# Patient Record
Sex: Male | Born: 1955 | Race: White | Hispanic: No | Marital: Married | Smoking: Former smoker
Health system: Southern US, Community
[De-identification: ages and names within clinical notes are randomized; demographics above are authoritative.]

## PROBLEM LIST (undated history)

## (undated) DIAGNOSIS — Z8619 Personal history of other infectious and parasitic diseases: Secondary | ICD-10-CM

## (undated) DIAGNOSIS — I10 Essential (primary) hypertension: Secondary | ICD-10-CM

## (undated) DIAGNOSIS — R7303 Prediabetes: Secondary | ICD-10-CM

## (undated) DIAGNOSIS — M1990 Unspecified osteoarthritis, unspecified site: Secondary | ICD-10-CM

## (undated) HISTORY — PX: JOINT REPLACEMENT: SHX530

## (undated) HISTORY — PX: EYE SURGERY: SHX253

## (undated) HISTORY — PX: KNEE ARTHROSCOPY: SHX127

## (undated) HISTORY — PX: ARTHROSCOPIC REPAIR PCL: SUR81

## (undated) HISTORY — PX: ANKLE FRACTURE SURGERY: SHX122

## (undated) HISTORY — PX: TOTAL SHOULDER REPLACEMENT: SUR1217

---

## 1983-10-04 HISTORY — PX: FINGER SURGERY: SHX640

## 2004-10-03 HISTORY — PX: INGUINAL HERNIA REPAIR: SUR1180

## 2006-07-13 ENCOUNTER — Inpatient Hospital Stay (HOSPITAL_COMMUNITY): Admission: RE | Admit: 2006-07-13 | Discharge: 2006-07-14 | Payer: Self-pay | Admitting: Orthopaedic Surgery

## 2011-01-02 DIAGNOSIS — Z8619 Personal history of other infectious and parasitic diseases: Secondary | ICD-10-CM

## 2011-01-02 HISTORY — DX: Personal history of other infectious and parasitic diseases: Z86.19

## 2012-12-25 ENCOUNTER — Other Ambulatory Visit: Payer: Self-pay | Admitting: Orthopedic Surgery

## 2012-12-27 ENCOUNTER — Other Ambulatory Visit: Payer: Self-pay | Admitting: Orthopedic Surgery

## 2013-01-02 ENCOUNTER — Encounter (HOSPITAL_COMMUNITY): Payer: Self-pay

## 2013-01-02 ENCOUNTER — Encounter (HOSPITAL_COMMUNITY): Payer: Self-pay | Admitting: Pharmacy Technician

## 2013-01-02 ENCOUNTER — Ambulatory Visit (HOSPITAL_COMMUNITY)
Admission: RE | Admit: 2013-01-02 | Discharge: 2013-01-02 | Disposition: A | Discharge status: Home / Self Care | Payer: BC Managed Care – PPO | Source: Ambulatory Visit | Attending: Orthopedic Surgery | Admitting: Orthopedic Surgery

## 2013-01-02 ENCOUNTER — Encounter (HOSPITAL_COMMUNITY)
Admission: RE | Admit: 2013-01-02 | Discharge: 2013-01-02 | Disposition: A | Discharge status: Home / Self Care | Payer: BC Managed Care – PPO | Source: Ambulatory Visit | Attending: Orthopedic Surgery | Admitting: Orthopedic Surgery

## 2013-01-02 DIAGNOSIS — Z01812 Encounter for preprocedural laboratory examination: Secondary | ICD-10-CM | POA: Insufficient documentation

## 2013-01-02 DIAGNOSIS — Z01810 Encounter for preprocedural cardiovascular examination: Secondary | ICD-10-CM | POA: Insufficient documentation

## 2013-01-02 DIAGNOSIS — Z01818 Encounter for other preprocedural examination: Secondary | ICD-10-CM | POA: Insufficient documentation

## 2013-01-02 HISTORY — DX: Unspecified osteoarthritis, unspecified site: M19.90

## 2013-01-02 HISTORY — DX: Essential (primary) hypertension: I10

## 2013-01-02 LAB — BASIC METABOLIC PANEL
Chloride: 100 mEq/L (ref 96–112)
GFR calc Af Amer: >90 mL/min (ref >90)
Potassium: 4.4 mEq/L (ref 3.5–5.1)
Sodium: 139 mEq/L (ref 135–145)

## 2013-01-02 LAB — CBC WITH DIFFERENTIAL/PLATELET
Basophils Absolute: 0.1 10*3/uL (ref 0.0–0.1)
HCT: 46.1 % (ref 39.0–52.0)
Hemoglobin: 16.1 g/dL (ref 13.0–17.0)
Lymphocytes Relative: 25 % (ref 12–46)
Monocytes Absolute: 0.5 10*3/uL (ref 0.1–1.0)
Monocytes Relative: 6 % (ref 3–12)
Neutro Abs: 5 10*3/uL (ref 1.7–7.7)
RDW: 12.7 % (ref 11.5–15.5)
WBC: 7.5 10*3/uL (ref 4.0–10.5)

## 2013-01-02 LAB — APTT: aPTT: 35 seconds (ref 24–37)

## 2013-01-02 LAB — URINALYSIS, ROUTINE W REFLEX MICROSCOPIC
Hgb urine dipstick: NEGATIVE
Ketones, ur: NEGATIVE mg/dL
Protein, ur: NEGATIVE mg/dL
Urobilinogen, UA: 1 mg/dL (ref 0.0–1.0)

## 2013-01-02 LAB — TYPE AND SCREEN
ABO/RH(D): O POS
Antibody Screen: NEGATIVE

## 2013-01-02 LAB — SURGICAL PCR SCREEN: MRSA, PCR: NEGATIVE

## 2013-01-02 NOTE — Pre-Procedure Instructions (Signed)
KAHLEEL FADELEY  01/02/2013   Your procedure is scheduled on:  Wednesday, April 9th  Report to [facility] at 1045 AM.  Call this number if you have problems the morning of surgery: [phone]   Remember:   Do not eat food or drink liquids after midnight.   Take these medicines the morning of surgery with A SIP OF WATER:    Do not wear jewelry, make-up or nail polish.  Do not wear lotions, powders, or perfume, deodorant.  Do not shave 48 hours prior to surgery. Men may shave face and neck.  Do not bring valuables to the hospital.  Contacts, dentures or bridgework may not be worn into surgery.  Leave suitcase in the car. After surgery it may be brought to your room.  For patients admitted to the hospital, checkout time is 11:00 AM the day of discharge.   Patients discharged the day of surgery will not be allowed to drive home.    Special Instructions: Shower using CHG 2 nights before surgery and the night before surgery.  If you shower the day of surgery use CHG.  Use special wash - you have one bottle of CHG for all showers.  You should use approximately 1/3 of the bottle for each shower.   Please read over the following fact sheets that you were given: Pain Booklet, Coughing and Deep Breathing, Blood Transfusion Information, MRSA Information and Surgical Site Infection Prevention

## 2013-01-02 NOTE — Progress Notes (Signed)
Primary Physician - Dr. Margarette Asal - [place] No recent cardiac testing. No cardiologist

## 2013-01-08 NOTE — H&P (Signed)
Cesar Davis is an 57 y.o. male.   Chief Complaint: Right knee pain HPI: Cesar Davis returns in followup for his right knee.  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  He reports significant pain postoperatively with minimal improvement.  He is ambulating without crutches and has returned to work, but he has been using Norco several times a day for pain.  He denies any problems with his surgical wound.  He has not had a fever.  Past Medical History  Diagnosis Date  . Hypertension   . Arthritis     Past Surgical History  Procedure Laterality Date  . Arthroscopic repair pcl    . Ankle surgery    . Finger surgery    . Joint replacement Right     shoulder    No family history on file. Social History:  reports that he has never smoked. His smokeless tobacco use includes Chew. He reports that  drinks alcohol. He reports that he does not use illicit drugs.  Allergies: No Known Allergies  No prescriptions prior to admission    No results found for this or any previous visit (from the past 48 hour(s)). No results found.  Review of Systems  Constitutional: Negative.   HENT: Negative.   Eyes: Negative.   Respiratory: Negative.   Cardiovascular: Negative.   Gastrointestinal: Negative.   Genitourinary: Negative.   Musculoskeletal: Positive for joint pain.  Skin: Negative.   Neurological: Negative.   Endo/Heme/Allergies: Negative.   Psychiatric/Behavioral: Negative.     There were no vitals taken for this visit. Physical Exam  Constitutional: He is oriented to person, place, and time. He appears well-developed and well-nourished.  HENT:  Head: Normocephalic.  Eyes: Pupils are equal, round, and reactive to light.  Neck: Normal range of motion.  Cardiovascular: Normal heart sounds.   Respiratory: Breath sounds normal.  Musculoskeletal:       Right knee: He  exhibits decreased range of motion and swelling. Tenderness found. Medial joint line tenderness noted.  Neurological: He is alert and oriented to person, place, and time.  Psychiatric: He has a normal mood and affect.    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  Assessment/Plan A: End-stage arthritis of the right knee s/p arthroscopy  P: We have discussed the options in detail with Cesar Davis today.  He is not interested in Visco supplementation injections at this point.  He would like to proceed with knee replacement.  All risks and benefits of surgery were discussed.    Towanna Avery M. 01/08/2013, 1:37 PM

## 2013-01-09 ENCOUNTER — Encounter (HOSPITAL_COMMUNITY): Payer: Self-pay | Admitting: *Deleted

## 2013-01-09 ENCOUNTER — Inpatient Hospital Stay (HOSPITAL_COMMUNITY)
Admission: RE | Admit: 2013-01-09 | Discharge: 2013-01-11 | DRG: 209 | Disposition: A | Discharge status: Home Health | Payer: BC Managed Care – PPO | Source: Ambulatory Visit | Attending: Orthopedic Surgery | Admitting: Orthopedic Surgery

## 2013-01-09 ENCOUNTER — Encounter (HOSPITAL_COMMUNITY)
Admission: RE | Disposition: A | Discharge status: Home Health | Payer: Self-pay | Source: Ambulatory Visit | Attending: Orthopedic Surgery

## 2013-01-09 ENCOUNTER — Inpatient Hospital Stay (HOSPITAL_COMMUNITY): Payer: BC Managed Care – PPO | Admitting: *Deleted

## 2013-01-09 DIAGNOSIS — M1711 Unilateral primary osteoarthritis, right knee: Secondary | ICD-10-CM

## 2013-01-09 DIAGNOSIS — I10 Essential (primary) hypertension: Secondary | ICD-10-CM | POA: Diagnosis present

## 2013-01-09 DIAGNOSIS — M129 Arthropathy, unspecified: Secondary | ICD-10-CM | POA: Diagnosis present

## 2013-01-09 DIAGNOSIS — M9420 Chondromalacia, unspecified site: Secondary | ICD-10-CM | POA: Diagnosis present

## 2013-01-09 DIAGNOSIS — Z79899 Other long term (current) drug therapy: Secondary | ICD-10-CM

## 2013-01-09 DIAGNOSIS — Z96619 Presence of unspecified artificial shoulder joint: Secondary | ICD-10-CM

## 2013-01-09 DIAGNOSIS — M1710 Unilateral primary osteoarthritis, unspecified knee: Principal | ICD-10-CM | POA: Diagnosis present

## 2013-01-09 HISTORY — PX: TOTAL KNEE ARTHROPLASTY: SHX125

## 2013-01-09 HISTORY — DX: Personal history of other infectious and parasitic diseases: Z86.19

## 2013-01-09 SURGERY — ARTHROPLASTY, KNEE, TOTAL
Anesthesia: Choice | Site: Knee | Laterality: Right | Wound class: Clean

## 2013-01-09 MED ORDER — LACTATED RINGERS IV SOLN
INTRAVENOUS | Status: DC
  Administered 2013-01-09: 12:00:00 via INTRAVENOUS

## 2013-01-09 MED ORDER — LACTATED RINGERS IV SOLN
INTRAVENOUS | Status: DC | PRN
  Administered 2013-01-09: 13:00:00 via INTRAVENOUS

## 2013-01-09 MED ORDER — SODIUM CHLORIDE 0.9 % IJ SOLN
1000.0000 ug | INTRAVENOUS | Status: DC | PRN
  Administered 2013-01-09: .2 ug/kg/min via INTRAVENOUS

## 2013-01-09 MED ORDER — HYDROMORPHONE HCL PF 1 MG/ML IJ SOLN: 0.2500 mg | INTRAMUSCULAR | Status: DC | PRN

## 2013-01-09 MED ORDER — ACETAMINOPHEN 10 MG/ML IV SOLN
1000.0000 mg | Freq: Four times a day (QID) | INTRAVENOUS | Status: AC
  Administered 2013-01-09 – 2013-01-10 (×4): 1000 mg via INTRAVENOUS
  Filled 2013-01-09 (×4): qty 100

## 2013-01-09 MED ORDER — ONDANSETRON HCL 4 MG/2ML IJ SOLN
INTRAMUSCULAR | Status: DC | PRN
  Administered 2013-01-09: 4 mg via INTRAVENOUS

## 2013-01-09 MED ORDER — PHENOL 1.4 % MT LIQD: 1.0000 | OROMUCOSAL | Status: DC | PRN

## 2013-01-09 MED ORDER — PROPOFOL 10 MG/ML IV BOLUS
INTRAVENOUS | Status: DC | PRN
  Administered 2013-01-09: 200 mg via INTRAVENOUS

## 2013-01-09 MED ORDER — MIDAZOLAM HCL 5 MG/5ML IJ SOLN
INTRAMUSCULAR | Status: DC | PRN
  Administered 2013-01-09: 2 mg via INTRAVENOUS

## 2013-01-09 MED ORDER — HYDROMORPHONE HCL PF 1 MG/ML IJ SOLN
INTRAMUSCULAR | Status: AC
  Filled 2013-01-09: qty 1

## 2013-01-09 MED ORDER — METHOCARBAMOL 500 MG PO TABS
500.0000 mg | ORAL_TABLET | Freq: Four times a day (QID) | ORAL | Status: DC | PRN
  Administered 2013-01-09 – 2013-01-11 (×6): 500 mg via ORAL
  Filled 2013-01-09 (×6): qty 1

## 2013-01-09 MED ORDER — METHOCARBAMOL 100 MG/ML IJ SOLN: 500.0000 mg | Freq: Four times a day (QID) | INTRAVENOUS | Status: DC | PRN

## 2013-01-09 MED ORDER — METOCLOPRAMIDE HCL 5 MG/ML IJ SOLN: 5.0000 mg | Freq: Three times a day (TID) | INTRAMUSCULAR | Status: DC | PRN

## 2013-01-09 MED ORDER — DEXTROSE-NACL 5-0.45 % IV SOLN: INTRAVENOUS | Status: DC

## 2013-01-09 MED ORDER — ALUM & MAG HYDROXIDE-SIMETH 200-200-20 MG/5ML PO SUSP: 30.0000 mL | ORAL | Status: DC | PRN

## 2013-01-09 MED ORDER — ONDANSETRON HCL 4 MG PO TABS: 4.0000 mg | ORAL_TABLET | Freq: Four times a day (QID) | ORAL | Status: DC | PRN

## 2013-01-09 MED ORDER — CHLORHEXIDINE GLUCONATE 4 % EX LIQD: 60.0000 mL | Freq: Once | CUTANEOUS | Status: DC

## 2013-01-09 MED ORDER — KCL IN DEXTROSE-NACL 20-5-0.45 MEQ/L-%-% IV SOLN
INTRAVENOUS | Status: DC
  Administered 2013-01-09 – 2013-01-10 (×2): via INTRAVENOUS
  Filled 2013-01-09 (×7): qty 1000

## 2013-01-09 MED ORDER — CEFUROXIME SODIUM 1.5 G IJ SOLR
INTRAMUSCULAR | Status: DC | PRN
  Administered 2013-01-09: 1.5 g

## 2013-01-09 MED ORDER — KCL IN DEXTROSE-NACL 20-5-0.45 MEQ/L-%-% IV SOLN
INTRAVENOUS | Status: AC
  Administered 2013-01-09: 1000 mL
  Filled 2013-01-09: qty 1000

## 2013-01-09 MED ORDER — HYDROMORPHONE HCL PF 1 MG/ML IJ SOLN
INTRAMUSCULAR | Status: DC | PRN
  Administered 2013-01-09 (×2): .5 mg via INTRAVENOUS

## 2013-01-09 MED ORDER — OXYCODONE HCL 5 MG PO TABS
5.0000 mg | ORAL_TABLET | ORAL | Status: DC | PRN
  Administered 2013-01-09 – 2013-01-11 (×10): 10 mg via ORAL
  Filled 2013-01-09 (×9): qty 2
  Filled 2013-01-09: qty 1
  Filled 2013-01-09: qty 2

## 2013-01-09 MED ORDER — BISACODYL 5 MG PO TBEC: 5.0000 mg | DELAYED_RELEASE_TABLET | Freq: Every day | ORAL | Status: DC | PRN

## 2013-01-09 MED ORDER — SODIUM CHLORIDE 0.9 % IJ SOLN
INTRAMUSCULAR | Status: DC | PRN
  Administered 2013-01-09: 14:00:00

## 2013-01-09 MED ORDER — MEPERIDINE HCL 25 MG/ML IJ SOLN: 6.2500 mg | INTRAMUSCULAR | Status: DC | PRN

## 2013-01-09 MED ORDER — METOPROLOL SUCCINATE ER 25 MG PO TB24
25.0000 mg | ORAL_TABLET | Freq: Every day | ORAL | Status: DC
  Administered 2013-01-10 – 2013-01-11 (×2): 25 mg via ORAL
  Filled 2013-01-09 (×2): qty 1

## 2013-01-09 MED ORDER — CEFAZOLIN SODIUM-DEXTROSE 2-3 GM-% IV SOLR
2.0000 g | INTRAVENOUS | Status: AC
  Administered 2013-01-09: 2 g via INTRAVENOUS

## 2013-01-09 MED ORDER — OXYCODONE HCL 5 MG PO TABS: 5.0000 mg | ORAL_TABLET | Freq: Once | ORAL | Status: DC | PRN

## 2013-01-09 MED ORDER — OXYCODONE HCL 5 MG/5ML PO SOLN: 5.0000 mg | Freq: Once | ORAL | Status: DC | PRN

## 2013-01-09 MED ORDER — METOCLOPRAMIDE HCL 10 MG PO TABS
5.0000 mg | ORAL_TABLET | Freq: Three times a day (TID) | ORAL | Status: DC | PRN
  Administered 2013-01-10: 10 mg via ORAL
  Filled 2013-01-09: qty 1

## 2013-01-09 MED ORDER — ACETAMINOPHEN 325 MG PO TABS: 650.0000 mg | ORAL_TABLET | Freq: Four times a day (QID) | ORAL | Status: DC | PRN

## 2013-01-09 MED ORDER — CELECOXIB 200 MG PO CAPS
200.0000 mg | ORAL_CAPSULE | Freq: Two times a day (BID) | ORAL | Status: DC
  Administered 2013-01-09 – 2013-01-11 (×4): 200 mg via ORAL
  Filled 2013-01-09 (×5): qty 1

## 2013-01-09 MED ORDER — HYDROMORPHONE HCL PF 1 MG/ML IJ SOLN
0.2500 mg | INTRAMUSCULAR | Status: DC | PRN
  Administered 2013-01-09 (×3): 0.5 mg via INTRAVENOUS

## 2013-01-09 MED ORDER — FLEET ENEMA 7-19 GM/118ML RE ENEM: 1.0000 | ENEMA | Freq: Once | RECTAL | Status: AC | PRN

## 2013-01-09 MED ORDER — NEOSTIGMINE METHYLSULFATE 1 MG/ML IJ SOLN
INTRAMUSCULAR | Status: DC | PRN
  Administered 2013-01-09: 5 mg via INTRAVENOUS

## 2013-01-09 MED ORDER — GLYCOPYRROLATE 0.2 MG/ML IJ SOLN
INTRAMUSCULAR | Status: DC | PRN
  Administered 2013-01-09: .6 mg via INTRAVENOUS
  Administered 2013-01-09: .2 mg via INTRAVENOUS

## 2013-01-09 MED ORDER — HYDROMORPHONE HCL PF 1 MG/ML IJ SOLN
INTRAMUSCULAR | Status: AC
  Administered 2013-01-09: 0.5 mg via INTRAVENOUS
  Filled 2013-01-09: qty 1

## 2013-01-09 MED ORDER — CEFUROXIME SODIUM 1.5 G IJ SOLR
INTRAMUSCULAR | Status: AC
  Filled 2013-01-09: qty 1.5

## 2013-01-09 MED ORDER — PROMETHAZINE HCL 25 MG/ML IJ SOLN: 6.2500 mg | INTRAMUSCULAR | Status: DC | PRN

## 2013-01-09 MED ORDER — ONDANSETRON HCL 4 MG/2ML IJ SOLN: 4.0000 mg | Freq: Four times a day (QID) | INTRAMUSCULAR | Status: DC | PRN

## 2013-01-09 MED ORDER — ACETAMINOPHEN 10 MG/ML IV SOLN
1000.0000 mg | Freq: Once | INTRAVENOUS | Status: DC
  Filled 2013-01-09: qty 100

## 2013-01-09 MED ORDER — MENTHOL 3 MG MT LOZG
1.0000 | LOZENGE | OROMUCOSAL | Status: DC | PRN
  Administered 2013-01-10: 3 mg via ORAL
  Filled 2013-01-09: qty 9

## 2013-01-09 MED ORDER — LIDOCAINE HCL (CARDIAC) 20 MG/ML IV SOLN
INTRAVENOUS | Status: DC | PRN
  Administered 2013-01-09: 100 mg via INTRAVENOUS

## 2013-01-09 MED ORDER — MAGNESIUM HYDROXIDE 400 MG/5ML PO SUSP: 30.0000 mL | Freq: Every day | ORAL | Status: DC | PRN

## 2013-01-09 MED ORDER — LISINOPRIL 10 MG PO TABS
10.0000 mg | ORAL_TABLET | Freq: Every day | ORAL | Status: DC
  Administered 2013-01-09 – 2013-01-11 (×3): 10 mg via ORAL
  Filled 2013-01-09 (×3): qty 1

## 2013-01-09 MED ORDER — ACETAMINOPHEN 10 MG/ML IV SOLN
INTRAVENOUS | Status: AC
  Administered 2013-01-09: 1000 mg via INTRAVENOUS
  Filled 2013-01-09: qty 100

## 2013-01-09 MED ORDER — ROCURONIUM BROMIDE 100 MG/10ML IV SOLN
INTRAVENOUS | Status: DC | PRN
  Administered 2013-01-09: 50 mg via INTRAVENOUS

## 2013-01-09 MED ORDER — ZOLPIDEM TARTRATE 5 MG PO TABS: 5.0000 mg | ORAL_TABLET | Freq: Every evening | ORAL | Status: DC | PRN

## 2013-01-09 MED ORDER — ACETAMINOPHEN 650 MG RE SUPP: 650.0000 mg | Freq: Four times a day (QID) | RECTAL | Status: DC | PRN

## 2013-01-09 MED ORDER — DIPHENHYDRAMINE HCL 12.5 MG/5ML PO ELIX: 12.5000 mg | ORAL_SOLUTION | ORAL | Status: DC | PRN

## 2013-01-09 MED ORDER — ASPIRIN EC 325 MG PO TBEC
325.0000 mg | DELAYED_RELEASE_TABLET | Freq: Two times a day (BID) | ORAL | Status: DC
  Administered 2013-01-09 – 2013-01-11 (×4): 325 mg via ORAL
  Filled 2013-01-09 (×5): qty 1

## 2013-01-09 MED ORDER — HYDROMORPHONE HCL PF 1 MG/ML IJ SOLN
1.0000 mg | INTRAMUSCULAR | Status: DC | PRN
  Administered 2013-01-09 – 2013-01-10 (×7): 1 mg via INTRAVENOUS
  Filled 2013-01-09 (×7): qty 1

## 2013-01-09 SURGICAL SUPPLY — 54 items
BANDAGE ELASTIC 6 VELCRO ST LF (GAUZE/BANDAGES/DRESSINGS) ×1 IMPLANT
BANDAGE ESMARK 6X9 LF (GAUZE/BANDAGES/DRESSINGS) ×1 IMPLANT
BLADE SAG 18X100X1.27 (BLADE) ×2 IMPLANT
BLADE SAW SGTL 13X75X1.27 (BLADE) ×2 IMPLANT
BLADE SURG ROTATE 9660 (MISCELLANEOUS) IMPLANT
BNDG CMPR 9X6 STRL LF SNTH (GAUZE/BANDAGES/DRESSINGS) ×1
BNDG CMPR MED 10X6 ELC LF (GAUZE/BANDAGES/DRESSINGS) ×1
BNDG ELASTIC 6X10 VLCR STRL LF (GAUZE/BANDAGES/DRESSINGS) ×2 IMPLANT
BNDG ESMARK 6X9 LF (GAUZE/BANDAGES/DRESSINGS) ×2
BOWL SMART MIX CTS (DISPOSABLE) ×2 IMPLANT
CEMENT HV SMART SET (Cement) ×4 IMPLANT
CLOTH BEACON ORANGE TIMEOUT ST (SAFETY) ×2 IMPLANT
COVER SURGICAL LIGHT HANDLE (MISCELLANEOUS) ×2 IMPLANT
CUFF TOURNIQUET SINGLE 34IN LL (TOURNIQUET CUFF) IMPLANT
CUFF TOURNIQUET SINGLE 44IN (TOURNIQUET CUFF) IMPLANT
DRAPE EXTREMITY T 121X128X90 (DRAPE) ×2 IMPLANT
DRAPE U-SHAPE 47X51 STRL (DRAPES) ×2 IMPLANT
DURAPREP 26ML APPLICATOR (WOUND CARE) ×2 IMPLANT
ELECT REM PT RETURN 9FT ADLT (ELECTROSURGICAL) ×2
ELECTRODE REM PT RTRN 9FT ADLT (ELECTROSURGICAL) ×1 IMPLANT
EVACUATOR 1/8 PVC DRAIN (DRAIN) ×2 IMPLANT
GAUZE XEROFORM 1X8 LF (GAUZE/BANDAGES/DRESSINGS) ×2 IMPLANT
GLOVE BIO SURGEON STRL SZ7 (GLOVE) ×2 IMPLANT
GLOVE BIO SURGEON STRL SZ7.5 (GLOVE) ×2 IMPLANT
GLOVE BIOGEL PI IND STRL 7.0 (GLOVE) ×1 IMPLANT
GLOVE BIOGEL PI IND STRL 8 (GLOVE) ×1 IMPLANT
GLOVE BIOGEL PI INDICATOR 7.0 (GLOVE) ×1
GLOVE BIOGEL PI INDICATOR 8 (GLOVE) ×1
GOWN PREVENTION PLUS XLARGE (GOWN DISPOSABLE) ×2 IMPLANT
GOWN STRL NON-REIN LRG LVL3 (GOWN DISPOSABLE) ×4 IMPLANT
HANDPIECE INTERPULSE COAX TIP (DISPOSABLE) ×2
HOOD PEEL AWAY FACE SHEILD DIS (HOOD) ×6 IMPLANT
KIT BASIN OR (CUSTOM PROCEDURE TRAY) ×2 IMPLANT
KIT ROOM TURNOVER OR (KITS) ×2 IMPLANT
MANIFOLD NEPTUNE II (INSTRUMENTS) ×2 IMPLANT
NS IRRIG 1000ML POUR BTL (IV SOLUTION) ×2 IMPLANT
PACK TOTAL JOINT (CUSTOM PROCEDURE TRAY) ×2 IMPLANT
PAD ARMBOARD 7.5X6 YLW CONV (MISCELLANEOUS) ×4 IMPLANT
PADDING CAST COTTON 6X4 STRL (CAST SUPPLIES) ×2 IMPLANT
SET HNDPC FAN SPRY TIP SCT (DISPOSABLE) ×1 IMPLANT
SPONGE GAUZE 4X4 12PLY (GAUZE/BANDAGES/DRESSINGS) ×3 IMPLANT
STAPLER VISISTAT 35W (STAPLE) ×2 IMPLANT
SUCTION FRAZIER TIP 10 FR DISP (SUCTIONS) ×2 IMPLANT
SUT VIC AB 0 CTX 36 (SUTURE) ×2
SUT VIC AB 0 CTX36XBRD ANTBCTR (SUTURE) ×1 IMPLANT
SUT VIC AB 1 CTX 36 (SUTURE) ×2
SUT VIC AB 1 CTX36XBRD ANBCTR (SUTURE) ×1 IMPLANT
SUT VIC AB 2-0 CT1 27 (SUTURE) ×2
SUT VIC AB 2-0 CT1 TAPERPNT 27 (SUTURE) ×1 IMPLANT
TOWEL OR 17X24 6PK STRL BLUE (TOWEL DISPOSABLE) ×2 IMPLANT
TOWEL OR 17X26 10 PK STRL BLUE (TOWEL DISPOSABLE) ×2 IMPLANT
TRAY FOLEY CATH 14FR (SET/KITS/TRAYS/PACK) IMPLANT
TRAY FOLEY CATH 16FR SILVER (SET/KITS/TRAYS/PACK) ×1 IMPLANT
WATER STERILE IRR 1000ML POUR (IV SOLUTION) ×4 IMPLANT

## 2013-01-09 NOTE — Transfer of Care (Signed)
Immediate Anesthesia Transfer of Care Note  Patient: Cesar Davis  Procedure(s) Performed: Procedure(s): TOTAL KNEE ARTHROPLASTY (Right)  Patient Location: PACU  Anesthesia Type:General  Level of Consciousness: awake and alert   Airway & Oxygen Therapy: Patient Spontanous Breathing and Patient connected to face mask oxygen  Post-op Assessment: Report given to PACU RN and Post -op Vital signs reviewed and stable  Post vital signs: Reviewed and stable  Complications: No apparent anesthesia complications

## 2013-01-09 NOTE — Preoperative (Signed)
Beta Blockers   Reason not to administer Beta Blockers:Not Applicable 

## 2013-01-09 NOTE — Anesthesia Postprocedure Evaluation (Signed)
  Anesthesia Post-op Note  Patient: Cesar Davis  Procedure(s) Performed: Procedure(s): TOTAL KNEE ARTHROPLASTY (Right)  Patient Location: PACU  Anesthesia Type:General  Level of Consciousness: awake and oriented  Airway and Oxygen Therapy: Patient Spontanous Breathing  Post-op Pain: moderate  Post-op Assessment: Post-op Vital signs reviewed  Post-op Vital Signs: stable  Complications: No apparent anesthesia complications

## 2013-01-09 NOTE — Op Note (Signed)
PATIENT ID:      Cesar Davis  MRN:     [id number] DOB/AGE:    1955/12/26 / 57 y.o.       OPERATIVE REPORT    DATE OF PROCEDURE:  01/09/2013       PREOPERATIVE DIAGNOSIS:   DEGENERATIVE JOINT DISEASE RIGHT KNEE      There is no weight on file to calculate BMI.                                                        POSTOPERATIVE DIAGNOSIS:   same                                                                      PROCEDURE:  Procedure(s): TOTAL KNEE ARTHROPLASTY Using Depuy Sigma RP implants #5R Femur, #5Tibia, 10mm sigma RP bearing, 38 Patella     SURGEON: Robbie Nangle J    ASSISTANT:   Shirl Harris PA-C   (Present and scrubbed throughout the case, critical for assistance with exposure, retraction, instrumentation, and closure.)         ANESTHESIA: GET with Femoral Nerve Block  DRAINS: foley, 2 medium hemovac in knee   TOURNIQUET TIME:   COMPLICATIONS:  None     SPECIMENS: None   INDICATIONS FOR PROCEDURE: The patient has  DEGENERATIVE JOINT DISEASE RIGHT KNEE, varus deformities, Arthroscopy showed large ares of trochlear and medial and lateral grade 4 chondromalacia. Patient has failed all conservative measures including anti-inflammatory medicines, narcotics, attempts at  exercise and weight loss, cortisone injections and arthroscopy.  Risks and benefits of surgery have been discussed, questions answered.   DESCRIPTION OF PROCEDURE: The patient identified by armband, received  right femoral nerve block and IV antibiotics, in the holding area at [facility]. Patient taken to the operating room, appropriate anesthetic  monitors were attached General endotracheal anesthesia induced with  the patient in supine position, Foley catheter was inserted. Tourniquet  applied high to the operative thigh. Lateral post and foot positioner  applied to the table, the lower extremity was then prepped and draped  in usual sterile fashion from the ankle to the tourniquet. Time-out  procedure was performed. The limb was wrapped with an Esmarch bandage and the tourniquet inflated to 350 mmHg. We began the operation by making the anterior midline incision starting at handbreadth above the patella going over the patella 1 cm medial to and  4 cm distal to the tibial tubercle. Small bleeders in the skin and the  subcutaneous tissue identified and cauterized. Transverse retinaculum was incised and reflected medially and a medial parapatellar arthrotomy was accomplished. the patella was everted and theprepatellar fat pad resected. The superficial medial collateral  ligament was then elevated from anterior to posterior along the proximal  flare of the tibia and anterior half of the menisci resected. The knee was hyperflexed exposing bone on bone arthritis. Peripheral and notch osteophytes as well as the cruciate ligaments were then resected. We continued to  work our way around posteriorly along the proximal tibia, and externally  rotated the tibia  subluxing it out from underneath the femur. A McHale  retractor was placed through the notch and a lateral Hohmann retractor  placed, and we then drilled through the proximal tibia in line with the  axis of the tibia followed by an intramedullary guide rod and 2-degree  posterior slope cutting guide. The tibial cutting guide was pinned into place  allowing resection of 8 mm of bone medially and about 8 mm of bone  laterally because of her varus deformity. Satisfied with the tibial resection, we then  entered the distal femur 2 mm anterior to the PCL origin with the  intramedullary guide rod and applied the distal femoral cutting guide  set at 11mm, with 5 degrees of valgus. This was pinned along the  epicondylar axis. At this point, the distal femoral cut was accomplished without difficulty. We then sized for a #5R femoral component and pinned the guide in 0 degrees of external rotation.The chamfer cutting guide was pinned into place. The  anterior, posterior, and chamfer cuts were accomplished without difficulty followed by  the Sigma RP box cutting guide and the box cut. We also removed posterior osteophytes from the posterior femoral condyles. At this  time, the knee was brought into full extension. We checked our  extension and flexion gaps and found them symmetric at 10mm.  The patella thickness measured at 26 mm. We set the cutting guide at 15 and removed the posterior 9.5-10 mm  of the patella sized for 38 button and drilled the lollipop. The knee  was then once again hyperflexed exposing the proximal tibia. We sized for a #5 tibial base plate, applied the smokestack and the conical reamer followed by the the Delta fin keel punch. We then hammered into place the Sigma RP trial femoral component, inserted a 10-mm trial bearing, trial patellar button, and took the knee through range of motion from 0-130 degrees. No thumb pressure was required for patellar  tracking. At this point, all trial components were removed, a double batch of DePuy HV cement with 1500 mg of Zinacef was mixed and applied to all bony metallic mating surfaces except for the posterior condyles of the femur itself. In order, we  hammered into place the tibial tray and removed excess cement, the femoral component and removed excess cement, a 10-mm Sigma RP bearing  was inserted, and the knee brought to full extension with compression.  The patellar button was clamped into place, and excess cement  removed. While the cement cured the wound was irrigated out with normal saline solution pulse lavage, and medium Hemovac drains were placed from an anterolateral  approach. Ligament stability and patellar tracking were checked and found to be excellent. The parapatellar arthrotomy was closed with  running #1 Vicryl suture. The subcutaneous tissue with 0 and 2-0 undyed  Vicryl suture, and the skin with skin staples. A dressing of Xeroform,  4 x 4, dressing sponges,  Webril, and Ace wrap applied. The patient  awakened, extubated, and taken to recovery room without difficulty.   Gean Birchwood J 01/09/2013, 2:26 PM

## 2013-01-09 NOTE — Progress Notes (Signed)
Dr. Jacklynn Bue called to inform patient is at 8/10 pain level no orders received to take patient to floor

## 2013-01-09 NOTE — Interval H&P Note (Signed)
History and Physical Interval Note:  01/09/2013 12:34 PM  Cesar Davis  has presented today for surgery, with the diagnosis of DEGENERATIVE JOINT DISEASE RIGHT KNEE  The various methods of treatment have been discussed with the patient and family. After consideration of risks, benefits and other options for treatment, the patient has consented to  Procedure(s): TOTAL KNEE ARTHROPLASTY (Right) as a surgical intervention .  The patient's history has been reviewed, patient examined, no change in status, stable for surgery.  I have reviewed the patient's chart and labs.  Questions were answered to the patient's satisfaction.     Nestor Lewandowsky

## 2013-01-09 NOTE — Anesthesia Procedure Notes (Signed)
Procedure Name: Intubation Date/Time: 01/09/2013 1:09 PM Performed by: Cesar Davis Pre-anesthesia Checklist: Patient identified, Emergency Drugs available, Suction available, Patient being monitored and Timeout performed Patient Re-evaluated:Patient Re-evaluated prior to inductionOxygen Delivery Method: Circle system utilized Preoxygenation: Pre-oxygenation with 100% oxygen Intubation Type: IV induction Ventilation: Mask ventilation without difficulty Laryngoscope Size: Miller and 2 Grade View: Grade II Tube type: Oral Tube size: 7.5 mm Airway Equipment and Method: Stylet Placement Confirmation: ETT inserted through vocal cords under direct vision,  positive ETCO2 and breath sounds checked- equal and bilateral Secured at: 23 cm Tube secured with: Tape Dental Injury: Teeth and Oropharynx as per pre-operative assessment

## 2013-01-09 NOTE — Progress Notes (Signed)
This note also relates to the following rows which could not be included: Pulse Rate - Cannot attach notes to unvalidated device data ECG Heart Rate - Cannot attach notes to unvalidated device data

## 2013-01-09 NOTE — Anesthesia Preprocedure Evaluation (Addendum)
Anesthesia Evaluation  Patient identified by MRN, date of birth, ID band Patient awake    Reviewed: Allergy & Precautions, H&P , NPO status , Patient's Chart, lab work & pertinent test results  History of Anesthesia Complications Negative for: history of anesthetic complications  Airway Mallampati: I  Neck ROM: full    Dental no notable dental hx. (+) Teeth Intact   Pulmonary neg pulmonary ROS,  breath sounds clear to auscultation  Pulmonary exam normal       Cardiovascular hypertension, On Medications negative cardio ROS  IRhythm:regular Rate:Normal     Neuro/Psych negative neurological ROS  negative psych ROS   GI/Hepatic negative GI ROS, Neg liver ROS,   Endo/Other  negative endocrine ROS  Renal/GU negative Renal ROS  negative genitourinary   Musculoskeletal   Abdominal   Peds  Hematology negative hematology ROS (+)   Anesthesia Other Findings   Reproductive/Obstetrics negative OB ROS                           Anesthesia Physical Anesthesia Plan  ASA: II  Anesthesia Plan: General and General ETT   Post-op Pain Management:    Induction:   Airway Management Planned:   Additional Equipment:   Intra-op Plan:   Post-operative Plan:   Informed Consent: I have reviewed the patients History and Physical, chart, labs and discussed the procedure including the risks, benefits and alternatives for the proposed anesthesia with the patient or authorized representative who has indicated his/her understanding and acceptance.     Plan Discussed with: CRNA and Surgeon  Anesthesia Plan Comments:         Anesthesia Quick Evaluation

## 2013-01-09 NOTE — Progress Notes (Signed)
Pt had exparel in OR and received pain med ication on coming out of [facility]  Dr. Jacklynn Bue caleed to see what he would like to do for pain med . Pat complains his pain is at 8/10 . May give hydromorphone 1 mg IVP

## 2013-01-09 NOTE — Progress Notes (Signed)
Patient remains in pain Dr. Jacklynn Bue called may give up to  another Mg of hydromorphone

## 2013-01-09 NOTE — Progress Notes (Signed)
Orthopedic Tech Progress Note Patient Details:  Cesar Davis [birth year] [id number]  CPM Right Knee CPM Right Knee: On Right Knee Flexion (Degrees): 60 Right Knee Extension (Degrees): 0 Additional Comments: trapeze bar   Cammer, Mickie Bail 01/09/2013, 4:30 PM

## 2013-01-10 ENCOUNTER — Encounter (HOSPITAL_COMMUNITY): Payer: Self-pay | Admitting: Orthopedic Surgery

## 2013-01-10 LAB — BASIC METABOLIC PANEL
Calcium: 8.9 mg/dL (ref 8.4–10.5)
GFR calc Af Amer: >90 mL/min (ref >90)
GFR calc non Af Amer: >90 mL/min (ref >90)
Glucose, Bld: 131 mg/dL — ABNORMAL HIGH (ref 70–99)
Potassium: 3.8 mEq/L (ref 3.5–5.1)
Sodium: 131 mEq/L — ABNORMAL LOW (ref 135–145)

## 2013-01-10 LAB — CBC
Hemoglobin: 12.9 g/dL — ABNORMAL LOW (ref 13.0–17.0)
MCH: 28.7 pg (ref 26.0–34.0)
MCHC: 34.9 g/dL (ref 30.0–36.0)
Platelets: 214 10*3/uL (ref 150–400)
RDW: 12.7 % (ref 11.5–15.5)

## 2013-01-10 MED ORDER — ASPIRIN EC 325 MG PO TBEC: 325.0000 mg | DELAYED_RELEASE_TABLET | Freq: Two times a day (BID) | ORAL | Status: DC

## 2013-01-10 MED ORDER — METHOCARBAMOL 500 MG PO TABS: 500.0000 mg | ORAL_TABLET | Freq: Four times a day (QID) | ORAL | Status: DC

## 2013-01-10 MED ORDER — OXYCODONE-ACETAMINOPHEN 5-325 MG PO TABS: 1.0000 | ORAL_TABLET | ORAL | Status: DC | PRN

## 2013-01-10 NOTE — Progress Notes (Signed)
Seen and agreed 01/10/2013 Robinette, Julia Elizabeth PTA [phone] pager [phone] office    

## 2013-01-10 NOTE — Progress Notes (Signed)
Physical Therapy Treatment Patient Details Name: Cesar Davis MRN: [id number] DOB: 1956-07-03 Today's Date: 01/10/2013 Time: 1355-1420 PT Time Calculation (min): 25 min  PT Assessment / Plan / Recommendation Comments on Treatment Session  Patient highly motivated.  Increased ambulation.  Increased Ther Ex.  Stair training next session.  Anticipate D/C home tomorrow based on goals.    Follow Up Recommendations  Home health PT     Does the patient have the potential to tolerate intense rehabilitation     Barriers to Discharge        Equipment Recommendations  Rolling walker with 5" wheels    Recommendations for Other Services    Frequency 7X/week   Plan Discharge plan remains appropriate;Frequency remains appropriate    Precautions / Restrictions Precautions Precautions: Fall Restrictions Weight Bearing Restrictions: Yes RLE Weight Bearing: Weight bearing as tolerated   Pertinent Vitals/Pain 10/10 R Knee Pain    Mobility  Bed Mobility Bed Mobility: Supine to Sit Supine to Sit: 5: Supervision;HOB flat Sitting - Scoot to Edge of Bed: 5: Supervision Details for Bed Mobility Assistance: Cues for safe technique Transfers Transfers: Sit to Stand;Stand to Sit Sit to Stand: 4: Min guard;With upper extremity assist;From bed Stand to Sit: 4: Min guard;With upper extremity assist;To chair/3-in-1 Details for Transfer Assistance: Cues for UE. Ambulation/Gait Ambulation/Gait Assistance: 4: Min guard Ambulation Distance (Feet): 60 Feet Assistive device: Rolling walker Ambulation/Gait Assistance Details: Cues for weight through LE.  Aware of sequencing. Gait Pattern: Step-to pattern;Decreased step length - left;Decreased stance time - right Gait velocity: Decreased. Stairs: No    Exercises Total Joint Exercises Quad Sets: AROM;Right;10 reps Heel Slides: AAROM;Right;10 reps Straight Leg Raises: AAROM;Right;10 reps Long Arc Quad: AAROM;Right;10 reps   PT Diagnosis:    PT  Problem List:   PT Treatment Interventions:     PT Goals Acute Rehab PT Goals PT Goal: Supine/Side to Sit - Progress: Progressing toward goal PT Goal: Sit to Stand - Progress: Progressing toward goal PT Goal: Stand to Sit - Progress: Progressing toward goal PT Goal: Ambulate - Progress: Progressing toward goal PT Goal: Perform Home Exercise Program - Progress: Progressing toward goal  Visit Information  Last PT Received On: 01/10/13 Assistance Needed: +1    Subjective Data      Cognition  Cognition Overall Cognitive Status: Appears within functional limits for tasks assessed/performed Arousal/Alertness: Awake/alert Orientation Level: Appears intact for tasks assessed Behavior During Session: [facility] for tasks performed    Balance     End of Session PT - End of Session Equipment Utilized During Treatment: Gait belt Activity Tolerance: Patient tolerated treatment well Patient left: in chair;with call bell/phone within reach   GP     [facility], Joana Nolton JEAN  SPTA 01/10/2013, 2:55 PM

## 2013-01-10 NOTE — Progress Notes (Signed)
Physical Therapy Evaluation  Past Medical History  Diagnosis Date  . Hypertension   . History of shingles 01/2012  . Arthritis     "right shoulder; knees; left ankle" (01/09/2013)   Past Surgical History  Procedure Laterality Date  . Arthroscopic repair pcl Right   . Finger surgery Bilateral 1985    "fusion pinky;   . Joint replacement Right     shoulder  . Total knee arthroplasty Right 01/09/2013  . Total shoulder replacement Right ~ 2006  . Knee arthroscopy Right 2010; 12/2012  . Ankle fracture surgery Left 2004?  Marland Kitchen Inguinal hernia repair Right 2006    01/10/13 0800  PT Visit Information  Last PT Received On 01/10/13  Assistance Needed +1  PT Time Calculation  PT Start Time 0805  PT Stop Time 0833  PT Time Calculation (min) 28 min  Subjective Data  Subjective I feel like I need to go to the bathroom.    Patient Stated Goal Home  Precautions  Precautions Fall  Restrictions  Weight Bearing Restrictions Yes  RLE Weight Bearing WBAT  Home Living  Lives With Spouse  Available Help at Discharge Family;Available 24 hours/day  Type of Home House  Home Access Stairs to enter  Entrance Stairs-Number of Steps 3  Home Layout One level  Home Adaptive Equipment None  Prior Function  Level of Independence Independent  Able to Take Stairs? Yes  Driving Yes  Communication  Communication No difficulties  Cognition  Overall Cognitive Status Appears within functional limits for tasks assessed/performed  Arousal/Alertness Awake/alert  Orientation Level Appears intact for tasks assessed  Behavior During Session [facility] for tasks performed  Right Lower Extremity Assessment  RLE ROM/Strength/Tone Deficits  RLE ROM/Strength/Tone Deficits AAROM ~10-65  RLE Sensation Deficits  RLE Sensation Deficits pt indicates still dull feeling since surgery.    Left Lower Extremity Assessment  LLE ROM/Strength/Tone WFL for tasks assessed  LLE Sensation WFL - Light Touch  Bed Mobility  Bed Mobility  Supine to Sit;Sitting - Scoot to Edge of Bed  Supine to Sit 4: Min assist;With rails;HOB elevated  Sitting - Scoot to [place] of Bed 5: Supervision  Details for Bed Mobility Assistance cues for sequencing and safe technique  Transfers  Transfers Sit to Stand;Stand to Sit  Sit to Stand 4: Min assist;With upper extremity assist;From bed  Stand to Sit 4: Min assist;With upper extremity assist;To chair/3-in-1;With armrests  Details for Transfer Assistance cues for UE use, getting closer prior to sitting, controlling descent to chair.    Ambulation/Gait  Ambulation/Gait Assistance 4: Min assist  Ambulation Distance (Feet) 15 Feet (x2)  Assistive device Rolling walker  Ambulation/Gait Assistance Details cues for RW use, upright posture, increased heel strike, R LE extension in stance.    Gait Pattern Step-to pattern;Decreased step length - left;Decreased stance time - right;Trunk flexed  Stairs No  Wheelchair Mobility  Wheelchair Mobility No  Balance  Balance Assessed No  Exercises  Exercises Total Joint  Total Joint Exercises  Ankle Circles/Pumps AROM;Both;10 reps  Quad Sets AROM;Both;10 reps  PT - End of Session  Equipment Utilized During Treatment Gait belt  Activity Tolerance Patient limited by fatigue  Patient left in chair;with call bell/phone within reach  Nurse Communication Mobility status  CPM Right Knee  CPM Right Knee Off  PT Assessment  Clinical Impression Statement pt presents with R TKA.  pt limited this am by nausea and decreased strength and control in R LE.  pt seems motivated and anticipate good  progress.    PT Recommendation/Assessment Patient needs continued PT services  PT Problem List Decreased strength;Decreased range of motion;Decreased activity tolerance;Decreased balance;Decreased mobility;Decreased knowledge of use of DME;Impaired sensation  Barriers to Discharge None  PT Therapy Diagnosis  Abnormality of gait  PT Plan  PT Frequency 7X/week  PT  Treatment/Interventions DME instruction;Gait training;Stair training;Functional mobility training;Therapeutic activities;Therapeutic exercise;Balance training;Patient/family education  PT Recommendation  Recommendations for Other Services OT consult  Follow Up Recommendations Home health PT;Supervision/Assistance - 24 hour  PT equipment Rolling walker with 5" wheels (3-in-1)  Individuals Consulted  Consulted and Agree with Results and Recommendations Patient  Acute Rehab PT Goals  PT Goal Formulation With patient  Time For Goal Achievement 01/24/13  Potential to Achieve Goals Good  Pt will go Supine/Side to Sit with modified independence  PT Goal: Supine/Side to Sit - Progress Goal set today  Pt will go Sit to Supine/Side with modified independence  PT Goal: Sit to Supine/Side - Progress Goal set today  Pt will go Sit to Stand with modified independence  PT Goal: Sit to Stand - Progress Goal set today  Pt will go Stand to Sit with modified independence  PT Goal: Stand to Sit - Progress Goal set today  Pt will Ambulate >150 feet;with modified independence;with rolling walker  PT Goal: Ambulate - Progress Goal set today  Pt will Go Up / Down Stairs 3-5 stairs;with min assist;with least restrictive assistive device  PT Goal: Up/Down Stairs - Progress Goal set today  Pt will Perform Home Exercise Program Independently  PT Goal: Perform Home Exercise Program - Progress Goal set today  PT General Charges  $$ ACUTE PT VISIT 1 Procedure  PT Evaluation  $Initial PT Evaluation Tier I 1 Procedure  PT Treatments  $Gait Training 8-22 mins  $Therapeutic Activity 8-22 mins   Karole Oo, PT [phone]

## 2013-01-10 NOTE — Progress Notes (Signed)
UR COMPLETED  

## 2013-01-10 NOTE — Discharge Planning (Signed)
Spoke with Dr. Turner Daniels re discharge planning, he will check with PA but is fairly certain that pt will not need iv abx on dc.

## 2013-01-10 NOTE — Progress Notes (Signed)
Patient ID: LONELL STAMOS, male   DOB: 02/16/1956, 57 y.o.   MRN: [id number] PATIENT ID: KEYONDRE HEPBURN  MRN: [id number]  DOB/AGE:  1955-10-17 / 57 y.o.  1 Day Post-Op Procedure(s) (LRB): TOTAL KNEE ARTHROPLASTY (Right)    PROGRESS NOTE Subjective: Patient is alert, oriented, no Nausea, no Vomiting, yes passing gas, no Bowel Movement. Taking PO well. Denies SOB, Chest or Calf Pain. Using Incentive Spirometer, PAS in place. Ambulate WBAT today, CPM 0-60 Patient reports pain as 8 on 0-10 scale  .    Objective: Vital signs in last 24 hours: Filed Vitals:   01/09/13 2000 01/09/13 2053 01/10/13 0058 01/10/13 0549  BP:  145/83 140/81 150/85  Pulse:  95 94 96  Temp:  98 F (36.7 C) 98 F (36.7 C) 98.3 F (36.8 C)  TempSrc:  Oral Oral Oral  Resp: 18 16 16 16   SpO2:  97% 94% 95%      Intake/Output from previous day: I/O last 3 completed shifts: In: 1710 [P.O.:460; I.V.:1250] Out: 750 [Urine:475; Drains:275]   Intake/Output this shift: Total I/O In: 120 [P.O.:120] Out: 1250 [Urine:1100; Drains:150]   LABORATORY DATA: No results found for this basename: WBC, HGB, HCT, PLT, NA, K, CL, CO2, BUN, CREATININE, GLUCOSE, GLUCAP, PT, INR, CALCIUM, 2,  in the last 72 hours  Examination: Neurologically intact ABD soft Neurovascular intact Sensation intact distally Intact pulses distally Dorsiflexion/Plantar flexion intact Incision: no drainage No cellulitis present Compartment soft}  Assessment:   1 Day Post-Op Procedure(s) (LRB): TOTAL KNEE ARTHROPLASTY (Right) ADDITIONAL DIAGNOSIS:  Hypertension  Plan: PT/OT WBAT, CPM 5/hrs day until ROM 0-90 degrees, then D/C CPM DVT Prophylaxis:  SCDx72hrs, ASA 325 mg BID x 2 weeks DISCHARGE PLAN: Home, probably 4/11 DISCHARGE NEEDS: HHPT, HHRN, CPM, Walker, 3-in-1 comode seat and IV Antibiotics     Anyjah Roundtree J 01/10/2013, 6:49 AM

## 2013-01-11 DIAGNOSIS — M1711 Unilateral primary osteoarthritis, right knee: Secondary | ICD-10-CM

## 2013-01-11 LAB — CBC
Hemoglobin: 11.8 g/dL — ABNORMAL LOW (ref 13.0–17.0)
MCH: 28.5 pg (ref 26.0–34.0)
MCV: 82.9 fL (ref 78.0–100.0)
Platelets: 191 10*3/uL (ref 150–400)
RBC: 4.14 MIL/uL — ABNORMAL LOW (ref 4.22–5.81)
WBC: 11.3 10*3/uL — ABNORMAL HIGH (ref 4.0–10.5)

## 2013-01-11 NOTE — Progress Notes (Signed)
Physical Therapy Treatment Patient Details Name: Cesar Davis MRN: [id number] DOB: 06-03-1956 Today's Date: 01/11/2013 Time: 4098-1191 PT Time Calculation (min): 31 min  PT Assessment / Plan / Recommendation Comments on Treatment Session  Patient progressed ambulation.  VC to negotiate stairs.  Practice again this afternoon.  Patient progressed with ther ex.  Patient reported swelling and increased pain.    Follow Up Recommendations  Home health PT     Does the patient have the potential to tolerate intense rehabilitation     Barriers to Discharge        Equipment Recommendations  Rolling walker with 5" wheels    Recommendations for Other Services    Frequency 7X/week   Plan Discharge plan remains appropriate;Frequency remains appropriate    Precautions / Restrictions Precautions Precautions: Fall Restrictions Weight Bearing Restrictions: Yes RLE Weight Bearing: Weight bearing as tolerated   Pertinent Vitals/Pain 8/10  Reports increased paine.    Mobility  Bed Mobility Bed Mobility: Supine to Sit Supine to Sit: 4: Min assist Sitting - Scoot to Edge of Bed: 5: Supervision Details for Bed Mobility Assistance: Min Assist for RLE.  Patient reported significant pain this morning and questioned swelling. Transfers Transfers: Stand to Sit;Sit to Stand Sit to Stand: 5: Supervision;With upper extremity assist;From bed;From chair/3-in-1 Stand to Sit: 5: Supervision;With upper extremity assist;To chair/3-in-1 Details for Transfer Assistance: Patient eager to stand.   Ambulation/Gait Ambulation/Gait Assistance: 5: Supervision Ambulation Distance (Feet): 200 Feet Assistive device: Rolling walker Ambulation/Gait Assistance Details: Cues for weight through LE.  Cues for posture. Gait Pattern: Step-to pattern;Decreased step length - left;Decreased stance time - right Gait velocity: Decreased. Stairs: Yes Stairs Assistance: 4: Min assist;4: Min IT consultant Details  (indicate cue type and reason): Cues for sequencing and technique. Stair Management Technique: Forwards;With walker Number of Stairs: 8    Exercises Total Joint Exercises Quad Sets: AROM;Right;15 reps Heel Slides: AAROM;Right;15 reps Straight Leg Raises: Right;15 reps;AAROM Long Arc Quad: AAROM;Right;15 reps   PT Diagnosis:    PT Problem List:   PT Treatment Interventions:     PT Goals Acute Rehab PT Goals PT Goal: Supine/Side to Sit - Progress: Progressing toward goal PT Goal: Sit to Stand - Progress: Progressing toward goal PT Goal: Stand to Sit - Progress: Progressing toward goal PT Goal: Ambulate - Progress: Progressing toward goal PT Goal: Up/Down Stairs - Progress: Progressing toward goal PT Goal: Perform Home Exercise Program - Progress: Progressing toward goal  Visit Information  Last PT Received On: 01/11/13    Subjective Data      Cognition  Cognition Overall Cognitive Status: Appears within functional limits for tasks assessed/performed Arousal/Alertness: Awake/alert Orientation Level: Appears intact for tasks assessed Behavior During Session: [facility] for tasks performed    Balance     End of Session PT - End of Session Equipment Utilized During Treatment: Gait belt Activity Tolerance: Patient tolerated treatment well Patient left: in chair;with call bell/phone within reach   GP     Maximino Greenland JEAN SPTA 01/11/2013, 8:28 AM  Seen and agreed with above 01/11/2013 Fredrich Birks PTA [phone] pager [phone] office

## 2013-01-11 NOTE — Progress Notes (Signed)
Physical Therapy Treatment Patient Details Name: Cesar Davis MRN: [id number] DOB: 01/21/1956 Today's Date: 01/11/2013 Time: 4098-1191 PT Time Calculation (min): 15 min  PT Assessment / Plan / Recommendation Comments on Treatment Session  Patient progressed ambulation. Min VC to negotiate stairs. Patient reported swelling and increased pain.    Follow Up Recommendations  Home health PT     Does the patient have the potential to tolerate intense rehabilitation     Barriers to Discharge        Equipment Recommendations  Rolling walker with 5" wheels    Recommendations for Other Services    Frequency 7X/week   Plan Discharge plan remains appropriate;Frequency remains appropriate    Precautions / Restrictions Precautions Precautions: Fall Restrictions Weight Bearing Restrictions: Yes RLE Weight Bearing: Weight bearing as tolerated   Pertinent Vitals/Pain 10/10 Pain R Knee.  Patient questioned swelling.    Mobility  Bed Mobility Bed Mobility: Supine to Sit Supine to Sit: 5: Supervision Details for Bed Mobility Assistance: Patient reported significant pain this morning and questioned swelling. Transfers Transfers: Stand to Sit;Sit to Stand Sit to Stand: 5: Supervision;With upper extremity assist;From bed Stand to Sit: 5: Supervision;With upper extremity assist;To chair/3-in-1 Details for Transfer Assistance: Patient eager to stand.   Ambulation/Gait Ambulation/Gait Assistance: 5: Supervision Ambulation Distance (Feet): 100 Feet Assistive device: Rolling walker Ambulation/Gait Assistance Details: Cues for weight through LE.  Cues for posture. Gait Pattern: Step-to pattern;Decreased step length - left;Decreased stance time - right Gait velocity: Decreased. Stairs: Yes Stairs Assistance: 4: Min guard Stairs Assistance Details (indicate cue type and reason): Min guard to ensure proper technique.   Stair Management Technique: Forwards;With walker Number of Stairs: 1     Exercises     PT Diagnosis:    PT Problem List:   PT Treatment Interventions:     PT Goals Acute Rehab PT Goals PT Goal: Supine/Side to Sit - Progress: Progressing toward goal PT Goal: Sit to Stand - Progress: Progressing toward goal PT Goal: Stand to Sit - Progress: Progressing toward goal PT Goal: Ambulate - Progress: Progressing toward goal PT Goal: Up/Down Stairs - Progress: Progressing toward goal PT Goal: Perform Home Exercise Program - Progress: Progressing toward goal  Visit Information  Last PT Received On: 01/11/13 Assistance Needed: +1    Subjective Data      Cognition  Cognition Overall Cognitive Status: Appears within functional limits for tasks assessed/performed Arousal/Alertness: Awake/alert Orientation Level: Appears intact for tasks assessed Behavior During Session: [facility] for tasks performed    Balance     End of Session PT - End of Session Equipment Utilized During Treatment: Gait belt Activity Tolerance: Patient tolerated treatment well Patient left: in chair;with call bell/phone within reach   GP     [facility], Cesar Davis SPTA 01/11/2013, 12:35 PM

## 2013-01-11 NOTE — Discharge Summary (Signed)
Patient ID: Cesar Davis MRN: [id number] DOB/AGE: 1956/08/13 57 y.o.  Admit date: 01/09/2013 Discharge date: 01/11/2013  Admission Diagnoses:  Principal Problem:   Osteoarthritis of right knee   Discharge Diagnoses:  Same  Past Medical History  Diagnosis Date  . Hypertension   . History of shingles 01/2012  . Arthritis     "right shoulder; knees; left ankle" (01/09/2013)    Surgeries: Procedure(s): TOTAL KNEE ARTHROPLASTY on 01/09/2013   Consultants:    Discharged Condition: Improved  Hospital Course: NAVIN DOGAN is an 57 y.o. male who was admitted 01/09/2013 for operative treatment ofOsteoarthritis of right knee. Patient has severe unremitting pain that affects sleep, daily activities, and work/hobbies. After pre-op clearance the patient was taken to the operating room on 01/09/2013 and underwent  Procedure(s): TOTAL KNEE ARTHROPLASTY.    Patient was given perioperative antibiotics: Anti-infectives   Start     Dose/Rate Route Frequency Ordered Stop   01/09/13 1331  cefUROXime (ZINACEF) injection  Status:  Discontinued       As needed 01/09/13 1331 01/09/13 1447   01/09/13 1045  ceFAZolin (ANCEF) IVPB 2 g/50 mL premix     2 g 100 mL/hr over 30 Minutes Intravenous On call to O.R. 01/09/13 1045 01/09/13 1308       Patient was given sequential compression devices, early ambulation, and chemoprophylaxis to prevent DVT.  Patient benefited maximally from hospital stay and there were no complications.    Recent vital signs: Patient Vitals for the past 24 hrs:  BP Temp Pulse Resp SpO2  01/11/13 0600 138/80 mmHg 98.2 F (36.8 C) 86 18 92 %  01/11/13 0400 - - - 16 95 %  01/11/13 0000 - - - 16 95 %  01/10/13 2107 136/79 mmHg 97.9 F (36.6 C) 92 18 93 %  01/10/13 2000 - - - 18 96 %  01/10/13 1600 - - - 18 96 %  01/10/13 1300 137/82 mmHg 99.1 F (37.3 C) 86 18 96 %  01/10/13 1119 - - - 18 95 %  01/10/13 1002 153/89 mmHg - - - -  01/10/13 0800 - - - 18 95 %     Recent  laboratory studies:  Recent Labs  01/10/13 0545  WBC 10.4  HGB 12.9*  HCT 37.0*  PLT 214  NA 131*  K 3.8  CL 97  CO2 23  BUN 11  CREATININE 0.83  GLUCOSE 131*  CALCIUM 8.9     Discharge Medications:     Medication List    STOP taking these medications       HYDROcodone-acetaminophen 5-325 MG per tablet  Commonly known as:  NORCO/VICODIN     meloxicam 15 MG tablet  Commonly known as:  MOBIC      TAKE these medications       ALKA-SELTZER ANTACID PO  Take 2 tablets by mouth daily.     aspirin EC 325 MG tablet  Take 1 tablet (325 mg total) by mouth 2 (two) times daily.     GLUCOSAMINE HCL-MSM PO  Take 2 tablets by mouth daily.     lisinopril 10 MG tablet  Commonly known as:  PRINIVIL,ZESTRIL  Take 10 mg by mouth daily.     methocarbamol 500 MG tablet  Commonly known as:  ROBAXIN  Take 1 tablet (500 mg total) by mouth 4 (four) times daily.     metoprolol succinate 50 MG 24 hr tablet  Commonly known as:  TOPROL-XL  Take 25 mg by mouth daily.  Take with or immediately following a meal.     multivitamin with minerals Tabs  Take 1 tablet by mouth daily.     oxyCODONE-acetaminophen 5-325 MG per tablet  Commonly known as:  ROXICET  Take 1-2 tablets by mouth every 4 (four) hours as needed for pain.     VITAMIN D (CHOLECALCIFEROL) PO  Take 1 tablet by mouth daily.        Diagnostic Studies: Dg Chest 2 View  01/02/2013  *RADIOLOGY REPORT*  Clinical Data: Preadmit right total knee  CHEST - 2 VIEW  Comparison: None  Findings: Heart size and vascularity are normal.  Lungs are clear without infiltrate or effusion.  Negative for mass lesion.  Right shoulder hemiarthroplasty.  IMPRESSION: No active cardiopulmonary abnormality.   Original Report Authenticated By: Janeece Riggers, M.D.     Disposition:       Discharge Orders   Future Orders Complete By Expires     Call MD for:  redness, tenderness, or signs of infection (pain, swelling, redness, odor or green/yellow  discharge around incision site)  As directed     Call MD for:  severe uncontrolled pain  As directed     Call MD for:  temperature >100.4  As directed     Change dressing (specify)  As directed     Comments:      Dressing change as needed.    Discharge instructions  As directed     Comments:      F/U with Dr. Turner Daniels as scheduled.    Driving Restrictions  As directed     Comments:      No driving for 2 weeks.    Increase activity slowly  As directed     May shower / [place]  As directed     Walker   As directed           Signed: Terisha Losasso M. 01/11/2013, 7:22 AM

## 2013-01-11 NOTE — Care Management Note (Signed)
CARE MANAGEMENT NOTE 01/11/2013  Patient:  Cesar Davis, Cesar Davis   Account Number:  [id number]  Date Initiated:  01/11/2013  Documentation initiated by:  Vance Peper  Subjective/Objective Assessment:   57 yr old male s/p right total knee arthroplasty     Action/Plan:   patient preoperatively setup with [facility], no changes. Patient has rolling walker, 3in1 and CPM.   Anticipated DC Date:  01/11/2013   Anticipated DC Plan:  HOME W HOME HEALTH SERVICES      DC Planning Services  CM consult      [facility] Choice  HOME HEALTH   Choice offered to / List presented to:          HH arranged  HH-2 PT      HH agency  [employer]   Status of service:  Completed, signed off Medicare Important Message given?   (If response is "NO", the following Medicare IM given date fields will be blank) Date Medicare IM given:   Date Additional Medicare IM given:    Discharge Disposition:  HOME W HOME HEALTH SERVICES  Per UR Regulation:    If discussed at Long Length of Stay Meetings, dates discussed:    Comments:

## 2013-01-11 NOTE — Progress Notes (Signed)
Seen and agreed 01/11/2013 Fredrich Birks PTA [phone] pager [phone] office

## 2013-01-11 NOTE — Progress Notes (Signed)
PATIENT ID: Cesar Davis  MRN: [id number]  DOB/AGE:  09-Jun-1956 / 57 y.o.  2 Days Post-Op Procedure(s) (LRB): TOTAL KNEE ARTHROPLASTY (Right)    PROGRESS NOTE Subjective: Patient is alert, oriented, no Nausea, no Vomiting, yes passing gas, no Bowel Movement. Taking PO well. Denies SOB, Chest or Calf Pain. Using Incentive Spirometer, PAS in place. Ambulating well with PT Patient reports pain as moderate  .    Objective: Vital signs in last 24 hours: Filed Vitals:   01/10/13 2107 01/11/13 0000 01/11/13 0400 01/11/13 0600  BP: 136/79   138/80  Pulse: 92   86  Temp: 97.9 F (36.6 C)   98.2 F (36.8 C)  TempSrc:      Resp: 18 16 16 18   SpO2: 93% 95% 95% 92%      Intake/Output from previous day: I/O last 3 completed shifts: In: 360 [P.O.:360] Out: 2550 [Urine:2400; Drains:150]   Intake/Output this shift:     LABORATORY DATA:  Recent Labs  01/10/13 0545  WBC 10.4  HGB 12.9*  HCT 37.0*  PLT 214  NA 131*  K 3.8  CL 97  CO2 23  BUN 11  CREATININE 0.83  GLUCOSE 131*  CALCIUM 8.9    Examination: Neurologically intact ABD soft Neurovascular intact Sensation intact distally Intact pulses distally Dorsiflexion/Plantar flexion intact Incision: dressing C/D/I}  Assessment:   2 Days Post-Op Procedure(s) (LRB): TOTAL KNEE ARTHROPLASTY (Right) ADDITIONAL DIAGNOSIS:  none  Plan: PT/OT WBAT, CPM 5/hrs day until ROM 0-90 degrees, then D/C CPM DVT Prophylaxis:  SCDx72hrs, ASA 325 mg BID x 2 weeks DISCHARGE PLAN: Home today if PT goals met. DISCHARGE NEEDS: HHPT, HHRN, CPM, Walker and 3-in-1 comode seat     Cesar Davis M. 01/11/2013, 7:19 AM

## 2013-08-12 ENCOUNTER — Other Ambulatory Visit: Payer: Self-pay | Admitting: Orthopedic Surgery

## 2013-08-19 ENCOUNTER — Other Ambulatory Visit: Payer: Self-pay | Admitting: Orthopedic Surgery

## 2013-08-19 DIAGNOSIS — M25511 Pain in right shoulder: Secondary | ICD-10-CM

## 2013-08-22 ENCOUNTER — Ambulatory Visit
Admission: RE | Admit: 2013-08-22 | Discharge: 2013-08-22 | Disposition: A | Discharge status: Home / Self Care | Payer: BC Managed Care – PPO | Source: Ambulatory Visit | Attending: Orthopedic Surgery | Admitting: Orthopedic Surgery

## 2013-08-22 DIAGNOSIS — M25511 Pain in right shoulder: Secondary | ICD-10-CM

## 2013-09-10 NOTE — Pre-Procedure Instructions (Addendum)
SOLAN VOSLER  09/10/2013   Your procedure is scheduled on:  Thursday, December 18th.  Report to [facility], Main Entrance/ Entrance "A" at 5:30AM.  Call this number if you have problems the morning of surgery: [phone]   Remember:   Do not eat food or drink liquids after midnight, Wednesday, December 17th.   Take these medicines the morning of surgery with A SIP OF WATER: Metoprolol (Toprol XL).  May take Methocarbamol (Robaxin), Oxycodone- Acetaminophen (Percocet) if needed.           STOP all herbel meds, nsaids (aleve,naproxen,advil,ibuprofen) 5 days prior to surgery including aspirin,glucosamine, vitamins   Do not wear jewelry, make-up or nail polish.  Do not wear lotions, powders, or perfumes. You may wear deodorant.   Men may shave face and neck.  Do not bring valuables to the hospital.  [facility] is not responsible                  for any belongings or valuables.             Contacts, dentures or bridgework may not be worn into surgery.  Leave suitcase in the car. After surgery it may be brought to your room.  For patients admitted to the hospital, discharge time is determined by your                treatment team.               Patients discharged the day of surgery will not be allowed to drive home.  Name and phone number of your driver: -  Special Instructions: Shower using CHG 2 nights before surgery and the night before surgery.  If you shower the day of surgery use CHG.  Use special wash - you have one bottle of CHG for all showers.  You should use approximately 1/3 of the bottle for each shower.   Please read over the following fact sheets that you were given: Pain Booklet, Coughing and Deep Breathing, Blood Transfusion Information and Surgical Site Infection Prevention

## 2013-09-11 ENCOUNTER — Encounter (HOSPITAL_COMMUNITY): Payer: Self-pay

## 2013-09-11 ENCOUNTER — Encounter (HOSPITAL_COMMUNITY)
Admission: RE | Admit: 2013-09-11 | Discharge: 2013-09-11 | Disposition: A | Discharge status: Home / Self Care | Payer: BC Managed Care – PPO | Source: Ambulatory Visit | Attending: Orthopedic Surgery | Admitting: Orthopedic Surgery

## 2013-09-11 DIAGNOSIS — Z01812 Encounter for preprocedural laboratory examination: Secondary | ICD-10-CM | POA: Insufficient documentation

## 2013-09-11 DIAGNOSIS — Z01818 Encounter for other preprocedural examination: Secondary | ICD-10-CM | POA: Insufficient documentation

## 2013-09-11 LAB — TYPE AND SCREEN: ABO/RH(D): O POS

## 2013-09-11 LAB — COMPREHENSIVE METABOLIC PANEL
ALT: 20 U/L (ref 0–53)
AST: 23 U/L (ref 0–37)
Albumin: 4.5 g/dL (ref 3.5–5.2)
Alkaline Phosphatase: 71 U/L (ref 39–117)
Chloride: 101 mEq/L (ref 96–112)
Potassium: 5.2 mEq/L — ABNORMAL HIGH (ref 3.5–5.1)
Sodium: 140 mEq/L (ref 135–145)
Total Bilirubin: 0.4 mg/dL (ref 0.3–1.2)
Total Protein: 7.6 g/dL (ref 6.0–8.3)

## 2013-09-11 LAB — CBC WITH DIFFERENTIAL/PLATELET
Basophils Absolute: 0.1 10*3/uL (ref 0.0–0.1)
Basophils Relative: 1 % (ref 0–1)
Eosinophils Relative: 3 % (ref 0–5)
HCT: 48.7 % (ref 39.0–52.0)
Lymphocytes Relative: 24 % (ref 12–46)
MCHC: 34.3 g/dL (ref 30.0–36.0)
MCV: 86.7 fL (ref 78.0–100.0)
Neutro Abs: 4 10*3/uL (ref 1.7–7.7)
Neutrophils Relative %: 62 % (ref 43–77)
RBC: 5.62 MIL/uL (ref 4.22–5.81)
RDW: 13.6 % (ref 11.5–15.5)
WBC: 6.4 10*3/uL (ref 4.0–10.5)

## 2013-09-11 LAB — PROTIME-INR: INR: 0.93 (ref 0.00–1.49)

## 2013-09-11 LAB — APTT: aPTT: 30 seconds (ref 24–37)

## 2013-09-11 LAB — C-REACTIVE PROTEIN: CRP: <0.5 mg/dL — ABNORMAL LOW (ref <0.60)

## 2013-09-18 MED ORDER — CEFAZOLIN SODIUM-DEXTROSE 2-3 GM-% IV SOLR
2.0000 g | INTRAVENOUS | Status: AC
  Administered 2013-09-19: 2 g via INTRAVENOUS
  Filled 2013-09-18: qty 50

## 2013-09-19 ENCOUNTER — Encounter (HOSPITAL_COMMUNITY)
Admission: RE | Disposition: A | Discharge status: Home / Self Care | Payer: Self-pay | Source: Ambulatory Visit | Attending: Orthopedic Surgery

## 2013-09-19 ENCOUNTER — Encounter (HOSPITAL_COMMUNITY): Payer: BC Managed Care – PPO | Admitting: Anesthesiology

## 2013-09-19 ENCOUNTER — Encounter (HOSPITAL_COMMUNITY): Payer: Self-pay | Admitting: *Deleted

## 2013-09-19 ENCOUNTER — Inpatient Hospital Stay (HOSPITAL_COMMUNITY): Payer: BC Managed Care – PPO | Admitting: Anesthesiology

## 2013-09-19 ENCOUNTER — Inpatient Hospital Stay (HOSPITAL_COMMUNITY): Payer: BC Managed Care – PPO

## 2013-09-19 ENCOUNTER — Inpatient Hospital Stay (HOSPITAL_COMMUNITY)
Admission: RE | Admit: 2013-09-19 | Discharge: 2013-09-20 | DRG: 517 | Disposition: A | Discharge status: Home / Self Care | Payer: BC Managed Care – PPO | Source: Ambulatory Visit | Attending: Orthopedic Surgery | Admitting: Orthopedic Surgery

## 2013-09-19 DIAGNOSIS — M1990 Unspecified osteoarthritis, unspecified site: Principal | ICD-10-CM | POA: Diagnosis present

## 2013-09-19 DIAGNOSIS — Z96659 Presence of unspecified artificial knee joint: Secondary | ICD-10-CM

## 2013-09-19 DIAGNOSIS — Z79899 Other long term (current) drug therapy: Secondary | ICD-10-CM

## 2013-09-19 DIAGNOSIS — Z96619 Presence of unspecified artificial shoulder joint: Secondary | ICD-10-CM

## 2013-09-19 DIAGNOSIS — Z87891 Personal history of nicotine dependence: Secondary | ICD-10-CM

## 2013-09-19 DIAGNOSIS — I10 Essential (primary) hypertension: Secondary | ICD-10-CM | POA: Diagnosis present

## 2013-09-19 DIAGNOSIS — M19019 Primary osteoarthritis, unspecified shoulder: Secondary | ICD-10-CM | POA: Diagnosis present

## 2013-09-19 HISTORY — PX: TOTAL SHOULDER ARTHROPLASTY: SHX126

## 2013-09-19 LAB — URINALYSIS, ROUTINE W REFLEX MICROSCOPIC
Glucose, UA: NEGATIVE mg/dL
Hgb urine dipstick: NEGATIVE
Ketones, ur: NEGATIVE mg/dL
Leukocytes, UA: NEGATIVE
Nitrite: NEGATIVE
pH: 5.5 (ref 5.0–8.0)

## 2013-09-19 SURGERY — ARTHROPLASTY, SHOULDER, TOTAL
Anesthesia: Regional | Site: Shoulder | Laterality: Right

## 2013-09-19 MED ORDER — HYDROMORPHONE HCL PF 1 MG/ML IJ SOLN: 0.2500 mg | INTRAMUSCULAR | Status: DC | PRN

## 2013-09-19 MED ORDER — METHOCARBAMOL 100 MG/ML IJ SOLN
500.0000 mg | Freq: Four times a day (QID) | INTRAVENOUS | Status: DC | PRN
  Filled 2013-09-19: qty 5

## 2013-09-19 MED ORDER — DOCUSATE SODIUM 100 MG PO CAPS
100.0000 mg | ORAL_CAPSULE | Freq: Two times a day (BID) | ORAL | Status: DC
  Administered 2013-09-19 – 2013-09-20 (×3): 100 mg via ORAL
  Filled 2013-09-19 (×3): qty 1

## 2013-09-19 MED ORDER — PHENOL 1.4 % MT LIQD: 1.0000 | OROMUCOSAL | Status: DC | PRN

## 2013-09-19 MED ORDER — PROPOFOL 10 MG/ML IV BOLUS
INTRAVENOUS | Status: DC | PRN
  Administered 2013-09-19: 250 mg via INTRAVENOUS

## 2013-09-19 MED ORDER — METOPROLOL SUCCINATE ER 25 MG PO TB24
25.0000 mg | ORAL_TABLET | Freq: Every day | ORAL | Status: DC
Start: 2013-09-20 — End: 2013-09-20
  Administered 2013-09-20: 25 mg via ORAL
  Filled 2013-09-19: qty 1

## 2013-09-19 MED ORDER — HYDROCODONE-ACETAMINOPHEN 5-325 MG PO TABS: 1.0000 | ORAL_TABLET | ORAL | Status: DC | PRN

## 2013-09-19 MED ORDER — DOCUSATE SODIUM 100 MG PO CAPS: 100.0000 mg | ORAL_CAPSULE | Freq: Three times a day (TID) | ORAL | Status: DC | PRN

## 2013-09-19 MED ORDER — DIPHENHYDRAMINE HCL 12.5 MG/5ML PO ELIX: 12.5000 mg | ORAL_SOLUTION | ORAL | Status: DC | PRN

## 2013-09-19 MED ORDER — LACTATED RINGERS IV SOLN
INTRAVENOUS | Status: DC | PRN
  Administered 2013-09-19 (×2): via INTRAVENOUS

## 2013-09-19 MED ORDER — POVIDONE-IODINE 7.5 % EX SOLN: Freq: Once | CUTANEOUS | Status: DC

## 2013-09-19 MED ORDER — ACETAMINOPHEN 650 MG RE SUPP: 650.0000 mg | Freq: Four times a day (QID) | RECTAL | Status: DC | PRN

## 2013-09-19 MED ORDER — MORPHINE SULFATE 2 MG/ML IJ SOLN
1.0000 mg | INTRAMUSCULAR | Status: DC | PRN
  Administered 2013-09-19 – 2013-09-20 (×3): 1 mg via INTRAVENOUS
  Filled 2013-09-19 (×3): qty 1

## 2013-09-19 MED ORDER — SODIUM CHLORIDE 0.9 % IR SOLN
Status: DC | PRN
  Administered 2013-09-19: 1000 mL

## 2013-09-19 MED ORDER — CEFAZOLIN SODIUM-DEXTROSE 2-3 GM-% IV SOLR
2.0000 g | Freq: Four times a day (QID) | INTRAVENOUS | Status: AC
  Administered 2013-09-19 – 2013-09-20 (×3): 2 g via INTRAVENOUS
  Filled 2013-09-19 (×4): qty 50

## 2013-09-19 MED ORDER — ROCURONIUM BROMIDE 100 MG/10ML IV SOLN
INTRAVENOUS | Status: DC | PRN
  Administered 2013-09-19: 10 mg via INTRAVENOUS
  Administered 2013-09-19: 50 mg via INTRAVENOUS
  Administered 2013-09-19: 10 mg via INTRAVENOUS

## 2013-09-19 MED ORDER — OXYCODONE HCL 5 MG PO TABS: 5.0000 mg | ORAL_TABLET | Freq: Once | ORAL | Status: DC | PRN

## 2013-09-19 MED ORDER — FLEET ENEMA 7-19 GM/118ML RE ENEM: 1.0000 | ENEMA | Freq: Once | RECTAL | Status: AC | PRN

## 2013-09-19 MED ORDER — OXYCODONE-ACETAMINOPHEN 5-325 MG PO TABS: 1.0000 | ORAL_TABLET | ORAL | Status: DC | PRN

## 2013-09-19 MED ORDER — ONDANSETRON HCL 4 MG PO TABS: 4.0000 mg | ORAL_TABLET | Freq: Four times a day (QID) | ORAL | Status: DC | PRN

## 2013-09-19 MED ORDER — ZOLPIDEM TARTRATE 5 MG PO TABS
5.0000 mg | ORAL_TABLET | Freq: Every evening | ORAL | Status: DC | PRN
  Administered 2013-09-20: 5 mg via ORAL
  Filled 2013-09-19: qty 1

## 2013-09-19 MED ORDER — ARTIFICIAL TEARS OP OINT
TOPICAL_OINTMENT | OPHTHALMIC | Status: DC | PRN
  Administered 2013-09-19: 1 via OPHTHALMIC

## 2013-09-19 MED ORDER — METOCLOPRAMIDE HCL 5 MG PO TABS
5.0000 mg | ORAL_TABLET | Freq: Three times a day (TID) | ORAL | Status: DC | PRN
  Filled 2013-09-19: qty 2

## 2013-09-19 MED ORDER — SODIUM CHLORIDE 0.9 % IV SOLN
INTRAVENOUS | Status: DC
  Administered 2013-09-19: 21:00:00 via INTRAVENOUS

## 2013-09-19 MED ORDER — PHENYLEPHRINE HCL 10 MG/ML IJ SOLN
10.0000 mg | INTRAVENOUS | Status: DC | PRN
  Administered 2013-09-19: 25 ug/min via INTRAVENOUS

## 2013-09-19 MED ORDER — MENTHOL 3 MG MT LOZG
1.0000 | LOZENGE | OROMUCOSAL | Status: DC | PRN
  Filled 2013-09-19: qty 9

## 2013-09-19 MED ORDER — ONDANSETRON HCL 4 MG/2ML IJ SOLN: 4.0000 mg | Freq: Four times a day (QID) | INTRAMUSCULAR | Status: DC | PRN

## 2013-09-19 MED ORDER — OXYCODONE HCL 5 MG/5ML PO SOLN: 5.0000 mg | Freq: Once | ORAL | Status: DC | PRN

## 2013-09-19 MED ORDER — BUPIVACAINE-EPINEPHRINE PF 0.5-1:200000 % IJ SOLN
INTRAMUSCULAR | Status: DC | PRN
  Administered 2013-09-19: 30 mL via PERINEURAL

## 2013-09-19 MED ORDER — METHOCARBAMOL 500 MG PO TABS
500.0000 mg | ORAL_TABLET | Freq: Four times a day (QID) | ORAL | Status: DC | PRN
  Administered 2013-09-19 – 2013-09-20 (×3): 500 mg via ORAL
  Filled 2013-09-19 (×4): qty 1

## 2013-09-19 MED ORDER — METOCLOPRAMIDE HCL 5 MG/ML IJ SOLN: 5.0000 mg | Freq: Three times a day (TID) | INTRAMUSCULAR | Status: DC | PRN

## 2013-09-19 MED ORDER — NEOSTIGMINE METHYLSULFATE 1 MG/ML IJ SOLN
INTRAMUSCULAR | Status: DC | PRN
  Administered 2013-09-19: 5 mg via INTRAVENOUS

## 2013-09-19 MED ORDER — ACETAMINOPHEN 325 MG PO TABS: 650.0000 mg | ORAL_TABLET | Freq: Four times a day (QID) | ORAL | Status: DC | PRN

## 2013-09-19 MED ORDER — OXYCODONE HCL 5 MG PO TABS
5.0000 mg | ORAL_TABLET | ORAL | Status: DC | PRN
  Administered 2013-09-19 – 2013-09-20 (×4): 10 mg via ORAL
  Filled 2013-09-19 (×5): qty 2

## 2013-09-19 MED ORDER — FENTANYL CITRATE 0.05 MG/ML IJ SOLN
INTRAMUSCULAR | Status: DC | PRN
  Administered 2013-09-19 (×4): 50 ug via INTRAVENOUS

## 2013-09-19 MED ORDER — HEMOSTATIC AGENTS (NO CHARGE) OPTIME
TOPICAL | Status: DC | PRN
  Administered 2013-09-19: 1 via TOPICAL

## 2013-09-19 MED ORDER — ALUMINUM HYDROXIDE GEL 320 MG/5ML PO SUSP
15.0000 mL | ORAL | Status: DC | PRN
  Filled 2013-09-19: qty 30

## 2013-09-19 MED ORDER — GLYCOPYRROLATE 0.2 MG/ML IJ SOLN
INTRAMUSCULAR | Status: DC | PRN
  Administered 2013-09-19: .8 mg via INTRAVENOUS

## 2013-09-19 MED ORDER — FENTANYL CITRATE 0.05 MG/ML IJ SOLN
INTRAMUSCULAR | Status: AC
  Filled 2013-09-19: qty 2

## 2013-09-19 MED ORDER — OXYCODONE-ACETAMINOPHEN 5-325 MG PO TABS
1.0000 | ORAL_TABLET | ORAL | Status: DC | PRN
  Administered 2013-09-19 – 2013-09-20 (×6): 2 via ORAL
  Filled 2013-09-19 (×7): qty 2

## 2013-09-19 MED ORDER — MIDAZOLAM HCL 2 MG/2ML IJ SOLN
INTRAMUSCULAR | Status: AC
  Filled 2013-09-19: qty 2

## 2013-09-19 MED ORDER — LIDOCAINE HCL 4 % MT SOLN
OROMUCOSAL | Status: DC | PRN
  Administered 2013-09-19: 4 mL via TOPICAL

## 2013-09-19 MED ORDER — BISACODYL 10 MG RE SUPP: 10.0000 mg | Freq: Every day | RECTAL | Status: DC | PRN

## 2013-09-19 MED ORDER — POLYETHYLENE GLYCOL 3350 17 G PO PACK: 17.0000 g | PACK | Freq: Every day | ORAL | Status: DC | PRN

## 2013-09-19 MED ORDER — LISINOPRIL 10 MG PO TABS
10.0000 mg | ORAL_TABLET | Freq: Every day | ORAL | Status: DC
  Administered 2013-09-20: 10 mg via ORAL
  Filled 2013-09-19: qty 1

## 2013-09-19 MED ORDER — MIDAZOLAM HCL 5 MG/5ML IJ SOLN
INTRAMUSCULAR | Status: DC | PRN
  Administered 2013-09-19: 2 mg via INTRAVENOUS

## 2013-09-19 MED ORDER — ONDANSETRON HCL 4 MG/2ML IJ SOLN
INTRAMUSCULAR | Status: DC | PRN
  Administered 2013-09-19: 4 mg via INTRAVENOUS

## 2013-09-19 MED ORDER — ASPIRIN EC 325 MG PO TBEC
325.0000 mg | DELAYED_RELEASE_TABLET | Freq: Two times a day (BID) | ORAL | Status: DC
  Administered 2013-09-19 – 2013-09-20 (×3): 325 mg via ORAL
  Filled 2013-09-19 (×5): qty 1

## 2013-09-19 SURGICAL SUPPLY — 74 items
BIT DRILL 5/64X5 DISP (BIT) IMPLANT
BLADE SAW SAG 73X25 THK (BLADE) ×1
BLADE SAW SGTL 73X25 THK (BLADE) ×1 IMPLANT
BLADE SURG 15 STRL LF DISP TIS (BLADE) ×1 IMPLANT
BLADE SURG 15 STRL SS (BLADE) ×2
BOWL SMART MIX CTS (DISPOSABLE) IMPLANT
CEMENT BONE DEPUY (Cement) ×2 IMPLANT
CHLORAPREP W/TINT 26ML (MISCELLANEOUS) ×4 IMPLANT
CLOTH BEACON ORANGE TIMEOUT ST (SAFETY) ×2 IMPLANT
CLSR STERI-STRIP ANTIMIC 1/2X4 (GAUZE/BANDAGES/DRESSINGS) ×1 IMPLANT
COVER SURGICAL LIGHT HANDLE (MISCELLANEOUS) ×2 IMPLANT
DRAPE INCISE IOBAN 66X45 STRL (DRAPES) ×4 IMPLANT
DRAPE SURG 17X23 STRL (DRAPES) ×2 IMPLANT
DRAPE U-SHAPE 47X51 STRL (DRAPES) ×2 IMPLANT
DRSG ADAPTIC 3X8 NADH LF (GAUZE/BANDAGES/DRESSINGS) IMPLANT
DRSG MEPILEX BORDER 4X12 (GAUZE/BANDAGES/DRESSINGS) ×1 IMPLANT
DRSG MEPILEX BORDER 4X8 (GAUZE/BANDAGES/DRESSINGS) ×2 IMPLANT
DRSG PAD ABDOMINAL 8X10 ST (GAUZE/BANDAGES/DRESSINGS) IMPLANT
ELECT BLADE 4.0 EZ CLEAN MEGAD (MISCELLANEOUS)
ELECT REM PT RETURN 9FT ADLT (ELECTROSURGICAL) ×2
ELECTRODE BLDE 4.0 EZ CLN MEGD (MISCELLANEOUS) IMPLANT
ELECTRODE REM PT RTRN 9FT ADLT (ELECTROSURGICAL) ×1 IMPLANT
EVACUATOR 1/8 PVC DRAIN (DRAIN) IMPLANT
GLENOID ANCHOR PEG CROSSLK 52 (Orthopedic Implant) ×1 IMPLANT
GLOVE BIO SURGEON STRL SZ7 (GLOVE) ×2 IMPLANT
GLOVE BIO SURGEON STRL SZ7.5 (GLOVE) ×2 IMPLANT
GLOVE BIOGEL PI IND STRL 7.0 (GLOVE) ×1 IMPLANT
GLOVE BIOGEL PI IND STRL 8 (GLOVE) ×1 IMPLANT
GLOVE BIOGEL PI INDICATOR 7.0 (GLOVE) ×1
GLOVE BIOGEL PI INDICATOR 8 (GLOVE) ×1
GOWN PREVENTION PLUS LG XLONG (DISPOSABLE) ×2 IMPLANT
GOWN STRL REIN XL XLG (GOWN DISPOSABLE) ×2 IMPLANT
HANDPIECE INTERPULSE COAX TIP (DISPOSABLE) ×2
HEAD HUM ECCENTRIC 48X18 STRL (Trauma) ×1 IMPLANT
HEMOSTAT SURGICEL 2X14 (HEMOSTASIS) IMPLANT
HOOD PEEL AWAY FACE SHEILD DIS (HOOD) ×4 IMPLANT
KIT BASIN OR (CUSTOM PROCEDURE TRAY) ×2 IMPLANT
KIT ROOM TURNOVER OR (KITS) ×2 IMPLANT
MANIFOLD NEPTUNE II (INSTRUMENTS) ×2 IMPLANT
NDL HYPO 25GX1X1/2 BEV (NEEDLE) IMPLANT
NDL MAYO TROCAR (NEEDLE) ×1 IMPLANT
NEEDLE HYPO 25GX1X1/2 BEV (NEEDLE) IMPLANT
NEEDLE MAYO TROCAR (NEEDLE) ×2 IMPLANT
NS IRRIG 1000ML POUR BTL (IV SOLUTION) ×2 IMPLANT
PACK SHOULDER (CUSTOM PROCEDURE TRAY) ×2 IMPLANT
PAD ARMBOARD 7.5X6 YLW CONV (MISCELLANEOUS) ×4 IMPLANT
PIN METAGLENE 2.5 (PIN) ×1 IMPLANT
RETRIEVER SUT HEWSON (MISCELLANEOUS) IMPLANT
SET HNDPC FAN SPRY TIP SCT (DISPOSABLE) ×1 IMPLANT
SLING ARM IMMOBILIZER LRG (SOFTGOODS) ×2 IMPLANT
SLING ARM IMMOBILIZER MED (SOFTGOODS) IMPLANT
SMARTMIX MINI TOWER (MISCELLANEOUS) ×2
SPONGE GAUZE 4X4 12PLY (GAUZE/BANDAGES/DRESSINGS) ×2 IMPLANT
SPONGE LAP 18X18 X RAY DECT (DISPOSABLE) ×2 IMPLANT
SPONGE LAP 4X18 X RAY DECT (DISPOSABLE) ×6 IMPLANT
STRIP CLOSURE SKIN 1/2X4 (GAUZE/BANDAGES/DRESSINGS) ×2 IMPLANT
SUCTION FRAZIER TIP 10 FR DISP (SUCTIONS) ×2 IMPLANT
SUPPORT WRAP ARM LG (MISCELLANEOUS) ×2 IMPLANT
SUT ETHIBOND 2 OS 4 DA (SUTURE) IMPLANT
SUT ETHIBOND NAB CT1 #1 30IN (SUTURE) ×2 IMPLANT
SUT FIBERWIRE #2 38 T-5 BLUE (SUTURE) ×8
SUT MNCRL AB 4-0 PS2 18 (SUTURE) ×2 IMPLANT
SUT SILK 2 0 TIES 17X18 (SUTURE)
SUT SILK 2-0 18XBRD TIE BLK (SUTURE) IMPLANT
SUT VIC AB 0 CTB1 27 (SUTURE) IMPLANT
SUT VIC AB 2-0 CT1 27 (SUTURE) ×2
SUT VIC AB 2-0 CT1 TAPERPNT 27 (SUTURE) ×1 IMPLANT
SUTURE FIBERWR #2 38 T-5 BLUE (SUTURE) ×3 IMPLANT
SYR CONTROL 10ML LL (SYRINGE) IMPLANT
TOWEL OR 17X24 6PK STRL BLUE (TOWEL DISPOSABLE) ×2 IMPLANT
TOWEL OR 17X26 10 PK STRL BLUE (TOWEL DISPOSABLE) ×2 IMPLANT
TOWER SMARTMIX MINI (MISCELLANEOUS) ×1 IMPLANT
TRAY FOLEY CATH 16FRSI W/METER (SET/KITS/TRAYS/PACK) IMPLANT
WATER STERILE IRR 1000ML POUR (IV SOLUTION) ×2 IMPLANT

## 2013-09-19 NOTE — Preoperative (Signed)
Beta Blockers   Reason not to administer Beta Blockers:Lopressor this am

## 2013-09-19 NOTE — Progress Notes (Signed)
Utilization review completed.  

## 2013-09-19 NOTE — Anesthesia Postprocedure Evaluation (Signed)
  Anesthesia Post-op Note  Patient: Cesar Davis  Procedure(s) Performed: Procedure(s) with comments: TOTAL SHOULDER ARTHROPLASTY (Right) - REVISION HEMIARTHROPLASTY TO TOTAL SHOULDER  Patient Location: PACU  Anesthesia Type:General and block  Level of Consciousness: awake and alert   Airway and Oxygen Therapy: Patient Spontanous Breathing  Post-op Pain: none  Post-op Assessment: Post-op Vital signs reviewed, Patient's Cardiovascular Status Stable and Respiratory Function Stable  Post-op Vital Signs: Reviewed  Filed Vitals:   09/19/13 1200  BP: 127/76  Pulse:   Temp: 36.4 C  Resp:     Complications: No apparent anesthesia complications

## 2013-09-19 NOTE — Transfer of Care (Signed)
Immediate Anesthesia Transfer of Care Note  Patient: Cesar Davis  Procedure(s) Performed: Procedure(s) with comments: TOTAL SHOULDER ARTHROPLASTY (Right) - REVISION HEMIARTHROPLASTY TO TOTAL SHOULDER  Patient Location: PACU  Anesthesia Type:General  Level of Consciousness: awake, oriented and patient cooperative  Airway & Oxygen Therapy: Patient Spontanous Breathing and Patient connected to face mask oxygen  Post-op Assessment: Report given to PACU RN, Post -op Vital signs reviewed and stable and Patient moving all extremities X 4  Post vital signs: Reviewed and stable  Complications: No apparent anesthesia complications

## 2013-09-19 NOTE — Anesthesia Preprocedure Evaluation (Addendum)
Anesthesia Evaluation  Patient identified by MRN, date of birth, ID band Patient awake    Reviewed: Allergy & Precautions, H&P , NPO status , Patient's Chart, lab work & pertinent test results, reviewed documented beta blocker date and time   Airway Mallampati: III TM Distance: >3 FB Neck ROM: Full    Dental no notable dental hx. (+) Teeth Intact and Dental Advisory Given   Pulmonary neg pulmonary ROS, former smoker,  breath sounds clear to auscultation  Pulmonary exam normal       Cardiovascular hypertension, On Medications and On Home Beta Blockers Rhythm:Regular Rate:Normal     Neuro/Psych negative neurological ROS  negative psych ROS   GI/Hepatic negative GI ROS, Neg liver ROS,   Endo/Other  negative endocrine ROS  Renal/GU negative Renal ROS  negative genitourinary   Musculoskeletal   Abdominal   Peds  Hematology negative hematology ROS (+)   Anesthesia Other Findings   Reproductive/Obstetrics negative OB ROS                          Anesthesia Physical Anesthesia Plan  ASA: II  Anesthesia Plan: General and Regional   Post-op Pain Management:    Induction: Intravenous  Airway Management Planned: Oral ETT  Additional Equipment:   Intra-op Plan:   Post-operative Plan: Extubation in OR  Informed Consent: I have reviewed the patients History and Physical, chart, labs and discussed the procedure including the risks, benefits and alternatives for the proposed anesthesia with the patient or authorized representative who has indicated his/her understanding and acceptance.   Dental advisory given  Plan Discussed with: CRNA  Anesthesia Plan Comments:         Anesthesia Quick Evaluation

## 2013-09-19 NOTE — Op Note (Signed)
Procedure(s): TOTAL SHOULDER ARTHROPLASTY Procedure Note  DAK SZUMSKI male 57 y.o. 09/19/2013  Procedure(s) and Anesthesia Type:    * Right revision TOTAL SHOULDER ARTHROPLASTY - Choice  Surgeon(s) and Role:    * Mable Paris, MD - Primary   Indications:  57 y.o. male  who has had multiple previous surgeries on right shoulder including a coracoid transfer his used and subsequent hemiarthroplasty 7 years ago. Have worsening pain in the shoulder which caused inability to perform his desired activities including golfing. He had some difficulty sleeping. He was given an intra-articular injection which gave him excellent relief of his pain but only for About one week. Ultimately he felt that he could not live with his shoulder his current condition and wished to go forward with revision surgery, understanding potential risks, including damage to neurovascular structures, infection, potential for incomplete pain relief etc. He understood that given his multiple previous surgeries on the shoulder and altered anatomy secondary to prior coracoid transfer risks of surgery are potentially higher. He understood all this and wish for surgery.      Surgeon: Mable Paris   Assistants: Damita Lack PA-C [facility] was present and scrubbed throughout the procedure and was essential in positioning, retraction, exposure, and closure)  Anesthesia: General endotracheal anesthesia with preoperative interscalene block    Procedure Detail  Revision TOTAL SHOULDER ARTHROPLASTY  Findings: There was abundant scar tissue. Appropriate surgical planes were able to be identified. The subscapularis and anterior capsular tissue was very thinned and diminutive. It was taken down as a sleeve and repaired at the conclusion of the procedure.  The head was removed and was noted to be slightly oversized. There was a large inferior humeral head osteophyte which was removed. There also several  large osteophytes the glenoid which were removed. A 52 anchor peg glenoid was placed and a 48 x 18 eccentric head was placed with good coverage and appropriate fit. Soft tissue tension was excellent.  Estimated Blood Loss:  300 mL         Drains: 1 medium hemovac  Blood Given: none          Specimens: none        Complications:  * No complications entered in OR log *         Disposition: PACU - hemodynamically stable.         Condition: stable    Procedure:   The patient was identified in the preoperative holding area where I personally marked the operative extremity after verifying with the patient and consent. He  was taken to the operating room where He was transferred to the   operative table.  The patient received an interscalene block in   the holding area by the attending anesthesiologist.  General anesthesia was induced   in the operating room without complication.  The patient did receive IV  Ancef prior to the commencement of the procedure.  The patient was   placed in the beach-chair position with the back raised about 30   degrees.  The nonoperative extremity and head and neck were carefully   positioned and padded protecting against neurovascular compromise.  The   left upper extremity was then prepped and draped in the standard sterile   fashion.    The appropriate operative time-out was performed with   Anesthesia, the perioperative staff, as well as myself and we all agreed   that the right side was the correct operative site.  An approximately 15 cm  incision was made at a point from the coracoid to the Center point of the humerus. This was about 5 cm lateral to his previous incision which I felt would be to medial to allow adequate glenoid exposure. Given the remote history of his previous surgery and excellent vascularity of the shoulder I felt that a parallel incision would be safe.  Careful dissection was made down through subcutaneous tissues and cephalic vein was  identified distally. Trace proximally to identify the deltopectoral interval. The coracoid cannot be used as an adequate landmark given its previous surgery. Careful dissection was made to identify the pectoralis major and carefully elevate the deltoid laterally off of the humerus. Retractor was placed under the distal deltoid insertion and then the undersurface the deltoid was developed proximally. Once the anterior humerus was exposed adequately the biceps tendon was palpated and the bicipital sheath was opened. The biceps was then released from its origin on the superior labrum. It was tenodesed distally to the upper border of the pectoralis major and the remains were discarded. The subscapularis and anterior capsule was noted the extremely thin. This was tagged with a suture and then as the arm was gently Externally Rotated taken off of the lesser tuberosity in one layer. This dissection was carried down distally onto the neck of the humerus exposing a large residual osteophyte. This was very carefully exposed with gentle external rotation. Once the capsular release was carried around to about the 6:00 position inferiorly the shoulder was dislocated. The previously placed head was removed with an impactor without difficulty. Cultures were taken and sent. A osteotome was then used to resect the inferior osteophyte extending around posteriorly. The trials were placed and a 48 x 18 eccentric head had the best fit. The humerus was then retracted posteriorly with a Fukuda retractor with the arm abducted and externally rotated slightly. And anterior glenoid retractor was placed over the rim.  The anterior capsule was then released from the 12 clock the 6:00 position and the quarter-inch osteotome was used to resect the anterior inferior osteophytes as well as a large posterior osteophyte from the glenoid. After adequate release and exposure the glenoid was sized and found to be a 52. The glenoid was then prepared with  the central drill and peripheral drill was with none of the holes exiting. Copious irrigation was used. The 52 anchor peg glenoid was then cemented into place with excellent fit. Bone quality was excellent. The proximal humerus was then again presented and the 48 x 18 eccentric head again was noted to be the best fit. The joint was reduced with the trial and had excellent soft tissue tension was about 50% posterior translation with immediate snap back to the anatomic position. Therefore the final 48 x 18 eccentric head was impacted the [facility] taper. A #2 fiber wires placed around the neck of the implant prior to impaction. The joint was reduced and copiously irrigated. The bur was used to decorticate the anterior lesser tuberosity and then the anterior capsule and subscapularis were repaired back to the tuberosity using 3 #2 fiber wires in a Mason-Allen configuration through bone tunnels and the suture around the implant was also used to reinforce the upper border of the repair. An ethibond was then used to close rotator interval.  With gentle external rotation to bring up to about 30 without significant tension on the anterior repair. Wound is then copiously irrigated and subsequently closed with 2-0 Vicryl in a deep dermal layer and 4-0 Monocryl  for skin closure. Steri-Strips were applied as well as a light sterile dressing. The patient was in place in a sling, allowed to awaken from general anesthesia, transferred to stretcher and taken to the recovery room in stable condition.       POSTOPERATIVE PLAN:  Given the thin and tenuous nature of his anterior soft tissues we will avoid early passive range of motion to allow things to heal in. He will have hand wrist and elbow motion only over the first 2 weeks and then will advance to 90 in neutral passive range of motion until 6 weeks, where he can advance to full passive range of motion. He'll be kept overnight hospital for observation and pain control.

## 2013-09-19 NOTE — Anesthesia Procedure Notes (Addendum)
Anesthesia Regional Block:  Interscalene brachial plexus block  Pre-Anesthetic Checklist: ,, timeout performed, Correct Patient, Correct Site, Correct Laterality, Correct Procedure, Correct Position, site marked, Risks and benefits discussed, pre-op evaluation,  At surgeon's request and post-op pain management  Laterality: Right  Prep: Maximum Sterile Barrier Precautions used and chloraprep       Needles:  Injection technique: Single-shot  Needle Type: Echogenic Stimulator Needle     Needle Length: 5cm 5 cm Needle Gauge: 22 and 22 G    Additional Needles:  Procedures: ultrasound guided (picture in chart) and nerve stimulator Interscalene brachial plexus block  Nerve Stimulator or Paresthesia:  Response: Biceps response,   Additional Responses:   Narrative:  Start time: 09/19/2013 7:15 AM End time: 09/19/2013 7:25 AM Injection made incrementally with aspirations every 5 mL. Anesthesiologist: Sampson Goon, MD  Additional Notes: 2% Lidocaine skin wheel.    Procedure Name: Intubation Date/Time: 09/19/2013 7:52 AM Performed by: Sherie Don Pre-anesthesia Checklist: Patient identified, Emergency Drugs available, Suction available, Patient being monitored and Timeout performed Patient Re-evaluated:Patient Re-evaluated prior to inductionOxygen Delivery Method: Circle system utilized Preoxygenation: Pre-oxygenation with 100% oxygen Intubation Type: IV induction Grade View: Grade II Tube type: Oral Tube size: 8.0 mm Number of attempts: 1 Airway Equipment and Method: Stylet Placement Confirmation: positive ETCO2,  ETT inserted through vocal cords under direct vision and breath sounds checked- equal and bilateral Secured at: 22 cm Tube secured with: Tape Dental Injury: Teeth and Oropharynx as per pre-operative assessment

## 2013-09-19 NOTE — H&P (Signed)
Cesar Davis is an 57 y.o. male.   Chief Complaint: R shoulder pain and dysfunction HPI: h/o R shoulder hemiarthroplasty with worsening pain and dysfunction.  Temporarily relieved with intraarticular injection.  Patient felt he could not live with shoulder in current condition.   Past Medical History  Diagnosis Date  . Hypertension   . History of shingles 01/2011  . Arthritis     "right shoulder; knees; left ankle" (01/09/2013)    Past Surgical History  Procedure Laterality Date  . Arthroscopic repair pcl Right   . Finger surgery Bilateral 1985    "fusion pinky;   . Joint replacement Right     shoulder  . Total knee arthroplasty Right 01/09/2013  . Total shoulder replacement Right ~ 2006  . Knee arthroscopy Right 2010; 12/2012  . Ankle fracture surgery Left 2004?  Marland Kitchen Inguinal hernia repair Right 2006  . Total knee arthroplasty Right 01/09/2013    Procedure: TOTAL KNEE ARTHROPLASTY;  Surgeon: Nestor Lewandowsky, MD;  Location: [facility] OR;  Service: Orthopedics;  Laterality: Right;    History reviewed. No pertinent family history. Social History:  reports that he quit smoking about 14 years ago. His smoking use included Cigarettes. He has a 7.5 pack-year smoking history. His smokeless tobacco use includes Snuff. He reports that he drinks about 3.6 ounces of alcohol per week. He reports that he does not use illicit drugs.  Allergies: No Known Allergies  Medications Prior to Admission  Medication Sig Dispense Refill  . Calcium Carbonate Antacid (ALKA-SELTZER ANTACID PO) Take 1 tablet by mouth daily as needed.       . Cholecalciferol (VITAMIN D3) 2000 UNITS TABS Take 2,000 Units by mouth daily.      Marland Kitchen HYDROcodone-acetaminophen (NORCO/VICODIN) 5-325 MG per tablet Take 1 tablet by mouth every 6 (six) hours as needed for moderate pain.      Marland Kitchen lisinopril (PRINIVIL,ZESTRIL) 10 MG tablet Take 10 mg by mouth daily.      . metoprolol succinate (TOPROL-XL) 50 MG 24 hr tablet Take 25 mg by mouth daily. Take  with or immediately following a meal.      . Multiple Vitamin (MULTIVITAMIN WITH MINERALS) TABS Take 1 tablet by mouth daily.      Marland Kitchen OVER THE COUNTER MEDICATION Take 4 tablets by mouth daily. "focus factor"        Results for orders placed during the hospital encounter of 09/19/13 (from the past 48 hour(s))  GLUCOSE, CAPILLARY     Status: Abnormal   Collection Time    09/19/13  5:50 AM      Result Value Range   Glucose-Capillary 103 (*) 70 - 99 mg/dL   No results found.  Review of Systems  All other systems reviewed and are negative.    Blood pressure 147/85, pulse 70, temperature 97.9 F (36.6 C), temperature source Oral, resp. rate 18, SpO2 97.00%. Physical Exam  Constitutional: He is oriented to person, place, and time. He appears well-developed and well-nourished.  HENT:  Head: Atraumatic.  Eyes: EOM are normal.  Cardiovascular: Intact distal pulses.   Respiratory: Effort normal.  Musculoskeletal:       Right shoulder: He exhibits decreased range of motion and pain.  Neurological: He is alert and oriented to person, place, and time.  Skin: Skin is warm and dry.  Psychiatric: He has a normal mood and affect.     Assessment/Plan Painful R shoulder hemi, with increased glenoid wear Plan revision to TSA Risks / benefits of surgery  discussed Consent on chart  NPO for OR Preop antibiotics   Merric Yost WILLIAM 09/19/2013, 7:20 AM

## 2013-09-20 LAB — BASIC METABOLIC PANEL
CO2: 28 mEq/L (ref 19–32)
Calcium: 8.6 mg/dL (ref 8.4–10.5)
Glucose, Bld: 107 mg/dL — ABNORMAL HIGH (ref 70–99)
Potassium: 3.9 mEq/L (ref 3.5–5.1)
Sodium: 135 mEq/L (ref 135–145)

## 2013-09-20 LAB — CBC
HCT: 40.6 % (ref 39.0–52.0)
MCH: 28.9 pg (ref 26.0–34.0)
Platelets: 234 10*3/uL (ref 150–400)
RBC: 4.63 MIL/uL (ref 4.22–5.81)
WBC: 8.4 10*3/uL (ref 4.0–10.5)

## 2013-09-20 NOTE — Discharge Summary (Signed)
Patient ID: SAXTON CHAIN MRN: [id number] DOB/AGE: 1956-05-27 57 y.o.  Admit date: 09/19/2013 Discharge date: 09/20/2013  Admission Diagnoses:  Active Problems:   Shoulder arthritis   Discharge Diagnoses:  Same  Past Medical History  Diagnosis Date  . Hypertension   . History of shingles 01/2011  . Arthritis     "right shoulder; knees; left ankle" (01/09/2013)    Surgeries: Procedure(s): TOTAL SHOULDER ARTHROPLASTY on 09/19/2013   Consultants:    Discharged Condition: Improved  Hospital Course: Cesar Davis is an 57 y.o. male who was admitted 09/19/2013 for operative treatment of right shoulder end stage osteoarthritis. Patient has severe unremitting pain that affects sleep, daily activities, and work/hobbies. After pre-op clearance the patient was taken to the operating room on 09/19/2013 and underwent  Procedure(s): TOTAL SHOULDER ARTHROPLASTY.    Patient was given perioperative antibiotics: Anti-infectives   Start     Dose/Rate Route Frequency Ordered Stop   09/19/13 1300  ceFAZolin (ANCEF) IVPB 2 g/50 mL premix     2 g 100 mL/hr over 30 Minutes Intravenous Every 6 hours 09/19/13 1113 09/20/13 0531   09/19/13 0600  ceFAZolin (ANCEF) IVPB 2 g/50 mL premix     2 g 100 mL/hr over 30 Minutes Intravenous On call to O.R. 09/18/13 1512 09/19/13 0745       Patient was given sequential compression devices, early ambulation, and ASA 325mg  BID to prevent DVT.  Patient benefited maximally from hospital stay and there were no complications.    Recent vital signs: Patient Vitals for the past 24 hrs:  BP Temp Pulse Resp SpO2  09/20/13 0632 114/82 mmHg 98.7 F (37.1 C) 76 18 99 %  09/19/13 2100 110/80 mmHg 97.6 F (36.4 C) 74 18 99 %     Recent laboratory studies:  Recent Labs  09/20/13 0555  WBC 8.4  HGB 13.4  HCT 40.6  PLT 234  NA 135  K 3.9  CL 101  CO2 28  BUN 11  CREATININE 1.11  GLUCOSE 107*  CALCIUM 8.6     Discharge Medications:     Medication List     STOP taking these medications       HYDROcodone-acetaminophen 5-325 MG per tablet  Commonly known as:  NORCO/VICODIN      TAKE these medications       ALKA-SELTZER ANTACID PO  Take 1 tablet by mouth daily as needed.     docusate sodium 100 MG capsule  Commonly known as:  COLACE  Take 1 capsule (100 mg total) by mouth 3 (three) times daily as needed.     lisinopril 10 MG tablet  Commonly known as:  PRINIVIL,ZESTRIL  Take 10 mg by mouth daily.     metoprolol succinate 50 MG 24 hr tablet  Commonly known as:  TOPROL-XL  Take 25 mg by mouth daily. Take with or immediately following a meal.     multivitamin with minerals Tabs tablet  Take 1 tablet by mouth daily.     OVER THE COUNTER MEDICATION  Take 4 tablets by mouth daily. "focus factor"     oxyCODONE-acetaminophen 5-325 MG per tablet  Commonly known as:  ROXICET  Take 1-2 tablets by mouth every 4 (four) hours as needed for severe pain.     Vitamin D3 2000 UNITS Tabs  Take 2,000 Units by mouth daily.        Diagnostic Studies: Ct Shoulder Right Wo Contrast  08/22/2013   CLINICAL DATA:  Right shoulder pain since football  injury at age 34. Four previous shoulder surgeries, most recently 7 years ago.  EXAM: CT OF THE RIGHT SHOULDER WITHOUT CONTRAST  TECHNIQUE: Multidetector CT imaging was performed according to the standard protocol. Multiplanar CT image reconstructions were also generated.  COMPARISON:  None.  FINDINGS: Patient is status post right shoulder hemiarthroplasty. The humeral head prosthesis creates moderate beam hardening artifact. There is no evidence hardware loosening, acute fracture or dislocation.  Underlying advanced glenohumeral degenerative changes are present with prominent osteophytes involving the posterior and inferior aspect of the glenoid and adjacent humeral head. These likely restrict external rotation of the shoulder. No loose bodies are observed. There is a metallic anchor screw within the  anterior inferior scapular neck, medial to the glenoid. There is an underlying well-defined osseous excrescence in this area which could be posttraumatic or reflect an osteochondroma. There is another broad-based osseous excrescence from the proximal humeral diaphysis anteriorly. This is directed inferiorly and may reflect an osteochondroma as well.  No focal rotator cuff muscular atrophy is identified. There is mild narrowing of the subacromial space anteriorly. The acromion has a type 2 or 3 shape.  IMPRESSION: 1. Prior right shoulder arthroplasty without evidence of loosening. 2. Underlying glenohumeral degenerative changes with prominent osteophytes posteriorly and inferiorly. 3. 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 4. Mild narrowing of the subacromial space without focal rotator cuff muscular atrophy.   Electronically Signed   By: Roxy Horseman M.D.   On: 08/22/2013 13:19   Dg Shoulder Right Port  09/19/2013   CLINICAL DATA:  Postop evaluation  EXAM: PORTABLE RIGHT SHOULDER - 2+ VIEW  COMPARISON:  None.  FINDINGS: A right shoulder arthroplasty is appreciated. Hardware appears intact. Native osseous structures unremarkable. Surgical drains appreciated.  IMPRESSION: Patient is status post right shoulder arthroplasty.   Electronically Signed   By: Salome Holmes M.D.   On: 09/19/2013 12:13    Disposition: 01-Home or Self Care      Discharge Orders   Future Orders Complete By Expires   Call MD / Call 911  As directed    Comments:     If you experience chest pain or shortness of breath, CALL 911 and be transported to the hospital emergency room.  If you develope a fever above 101 F, pus (white drainage) or increased drainage or redness at the wound, or calf pain, call your surgeon's office.   Constipation Prevention  As directed    Comments:     Drink plenty of fluids.  Prune juice may be helpful.  You may use a stool softener, such as Colace (over the  counter) 100 mg twice a day.  Use MiraLax (over the counter) for constipation as needed.   Diet - low sodium heart healthy  As directed    Increase activity slowly as tolerated  As directed       Follow-up Information   Follow up with Mable Paris, MD. Schedule an appointment as soon as possible for a visit in 2 weeks.   Specialty:  Orthopedic Surgery   Contact information:   [address] [place] [address] [phone]        Signed: Jiles Harold 09/20/2013, 4:00 PM

## 2013-09-20 NOTE — Evaluation (Signed)
Occupational Therapy Evaluation and Discharge Summary Patient Details Name: Cesar Davis MRN: [id number] DOB: [birth year] Today's Date: 09/20/2013 Time: 7829-5621 OT Time Calculation (min): 12 min  OT Assessment / Plan / Recommendation History of present illness Pt admitted for redo total shoulder replacment.   Clinical Impression   Pt admitted for above diagnosis.  Pt very familiar with ADL techniques from previous surgeries and is overall I with adls.  Pt only allowed to do wrist/hand/elbow exercises and is I with these exercises.  Pt not in need of further acute OT.    OT Assessment  Progress rehab of shoulder as ordered by MD at follow-up appointment    Follow Up Recommendations  No OT follow up    Barriers to Discharge      Equipment Recommendations  None recommended by OT    Recommendations for Other Services    Frequency       Precautions / Restrictions Precautions Precautions: Shoulder Type of Shoulder Precautions: No active movement at this time.  Sling at all times. Shoulder Interventions: Shoulder sling/immobilizer;At all times;Off for dressing/bathing/exercises Required Braces or Orthoses: Sling Restrictions Weight Bearing Restrictions: Yes RUE Weight Bearing: Non weight bearing   Pertinent Vitals/Pain Pt with 5/10 pain    ADL  Eating/Feeding: Performed;Set up Where Assessed - Eating/Feeding: Chair Grooming: Performed;Set up Where Assessed - Grooming: Unsupported standing Upper Body Bathing: Performed;Set up Where Assessed - Upper Body Bathing: Unsupported sit to stand Lower Body Bathing: Simulated;Minimal assistance Where Assessed - Lower Body Bathing: Unsupported sit to stand Upper Body Dressing: Performed;Minimal assistance Where Assessed - Upper Body Dressing: Supported sit to stand Lower Body Dressing: Performed;Set up Where Assessed - Lower Body Dressing: Unsupported sit to stand Toilet Transfer: Performed;Supervision/safety Toilet Transfer  Method: Stand pivot Acupuncturist: Comfort height toilet Toileting - Architect and Hygiene: Performed;Supervision/safety Where Assessed - Engineer, mining and Hygiene: Standing Tub/Shower Transfer: Engineer, manufacturing Method: Science writer: Walk in Scientist, research (physical sciences) Used: Other (comment) (sling) Transfers/Ambulation Related to ADLs: Pt walked in room and in hallway Ily ADL Comments: Pt does well with adls.  Pt has had multiple shoudler surgeries and knows all techniques for bathing and dressing.    OT Diagnosis:    OT Problem List:   OT Treatment Interventions:     OT Goals(Current goals can be found in the care plan section) Acute Rehab OT Goals Patient Stated Goal: to get on home  Visit Information  Last OT Received On: 09/20/13 Assistance Needed: +1 History of Present Illness: Pt admitted for redo total shoulder replacment.       Prior Functioning     Home Living Family/patient expects to be discharged to:: Private residence Living Arrangements: Spouse/significant other Available Help at Discharge: Family;Available 24 hours/day Type of Home: House Home Access: Stairs to enter [employer] of Steps: 3 Home Layout: One level Home Equipment: None Prior Function Level of Independence: Independent Communication Communication: No difficulties Dominant Hand: Right         Vision/Perception     Cognition  Cognition Arousal/Alertness: Awake/alert Behavior During Therapy: WFL for tasks assessed/performed Overall Cognitive Status: Within Functional Limits for tasks assessed    Extremity/Trunk Assessment Upper Extremity Assessment Upper Extremity Assessment: RUE deficits/detail RUE Deficits / Details: Pt unable to move shoulder at this time due to surgery.  Wrist, hand and elbow WFL with ROM RUE: Unable to fully assess due to immobilization RUE Coordination:  decreased gross motor Lower Extremity Assessment Lower Extremity Assessment: Overall [facility]  for tasks assessed Cervical / Trunk Assessment Cervical / Trunk Assessment: Normal     Mobility Bed Mobility Bed Mobility: Supine to Sit Supine to Sit: 7: Independent Details for Bed Mobility Assistance: No assist needed Transfers Transfers: Sit to Stand;Stand to Sit Sit to Stand: 7: Independent Stand to Sit: 7: Independent Details for Transfer Assistance: Independent     Exercise     Balance Balance Balance Assessed: Yes Dynamic Standing Balance Dynamic Standing - Balance Support: During functional activity Dynamic Standing - Level of Assistance: 7: Independent Dynamic Standing - Balance Activities: Reaching for objects   End of Session OT - End of Session Equipment Utilized During Treatment: Other (comment) (sling) Activity Tolerance: Patient tolerated treatment well Patient left: in chair;with call bell/phone within reach Nurse Communication: Mobility status  GO     Hope Budds 09/20/2013, 11:10 AM [phone]

## 2013-09-20 NOTE — Progress Notes (Signed)
PATIENT ID: Cesar Davis   1 Day Post-Op Procedure(s) (LRB): TOTAL SHOULDER ARTHROPLASTY (Right)  Subjective: Reports doing well this am, pain in right shoulder getting under control with oral medications. Block wore off last night. No other complaints or concerns.   Objective:  Filed Vitals:   09/20/13 0632  BP: 114/82  Pulse: 76  Temp: 98.7 F (37.1 C)  Resp: 18     R UE dressing c/d/i Right shoulder/UE with moderate swelling Wiggles fingers, distally NVI Activates deltoid  Labs:   Recent Labs  09/20/13 0555  HGB 13.4   Recent Labs  09/20/13 0555  WBC 8.4  RBC 4.63  HCT 40.6  PLT 234   Recent Labs  09/20/13 0555  NA 135  K 3.9  CL 101  CO2 28  BUN 11  CREATININE 1.11  GLUCOSE 107*  CALCIUM 8.6    Assessment and Plan: 1 day s/p revision hemi to TSA, right Will work with OT on hand, wrist, elbow ROM only In sling full time x 4 weeks Okay to d/c home today after OT Percocet for home pain control, script in chart Follow up with Dr. Ave Filter in 2 weeks  VTE proph: ASA 325mg  BID, SCDs

## 2013-09-22 LAB — BODY FLUID CULTURE

## 2013-09-24 ENCOUNTER — Encounter (HOSPITAL_COMMUNITY): Payer: Self-pay | Admitting: Orthopedic Surgery

## 2013-09-24 LAB — ANAEROBIC CULTURE

## 2014-04-16 IMAGING — CR DG CHEST 2V
2 series · 2 of 2 positions shown · non-contrast
Comparison: None

CLINICAL DATA: Preadmit right total knee

CHEST - 2 VIEW

[w chest pa]
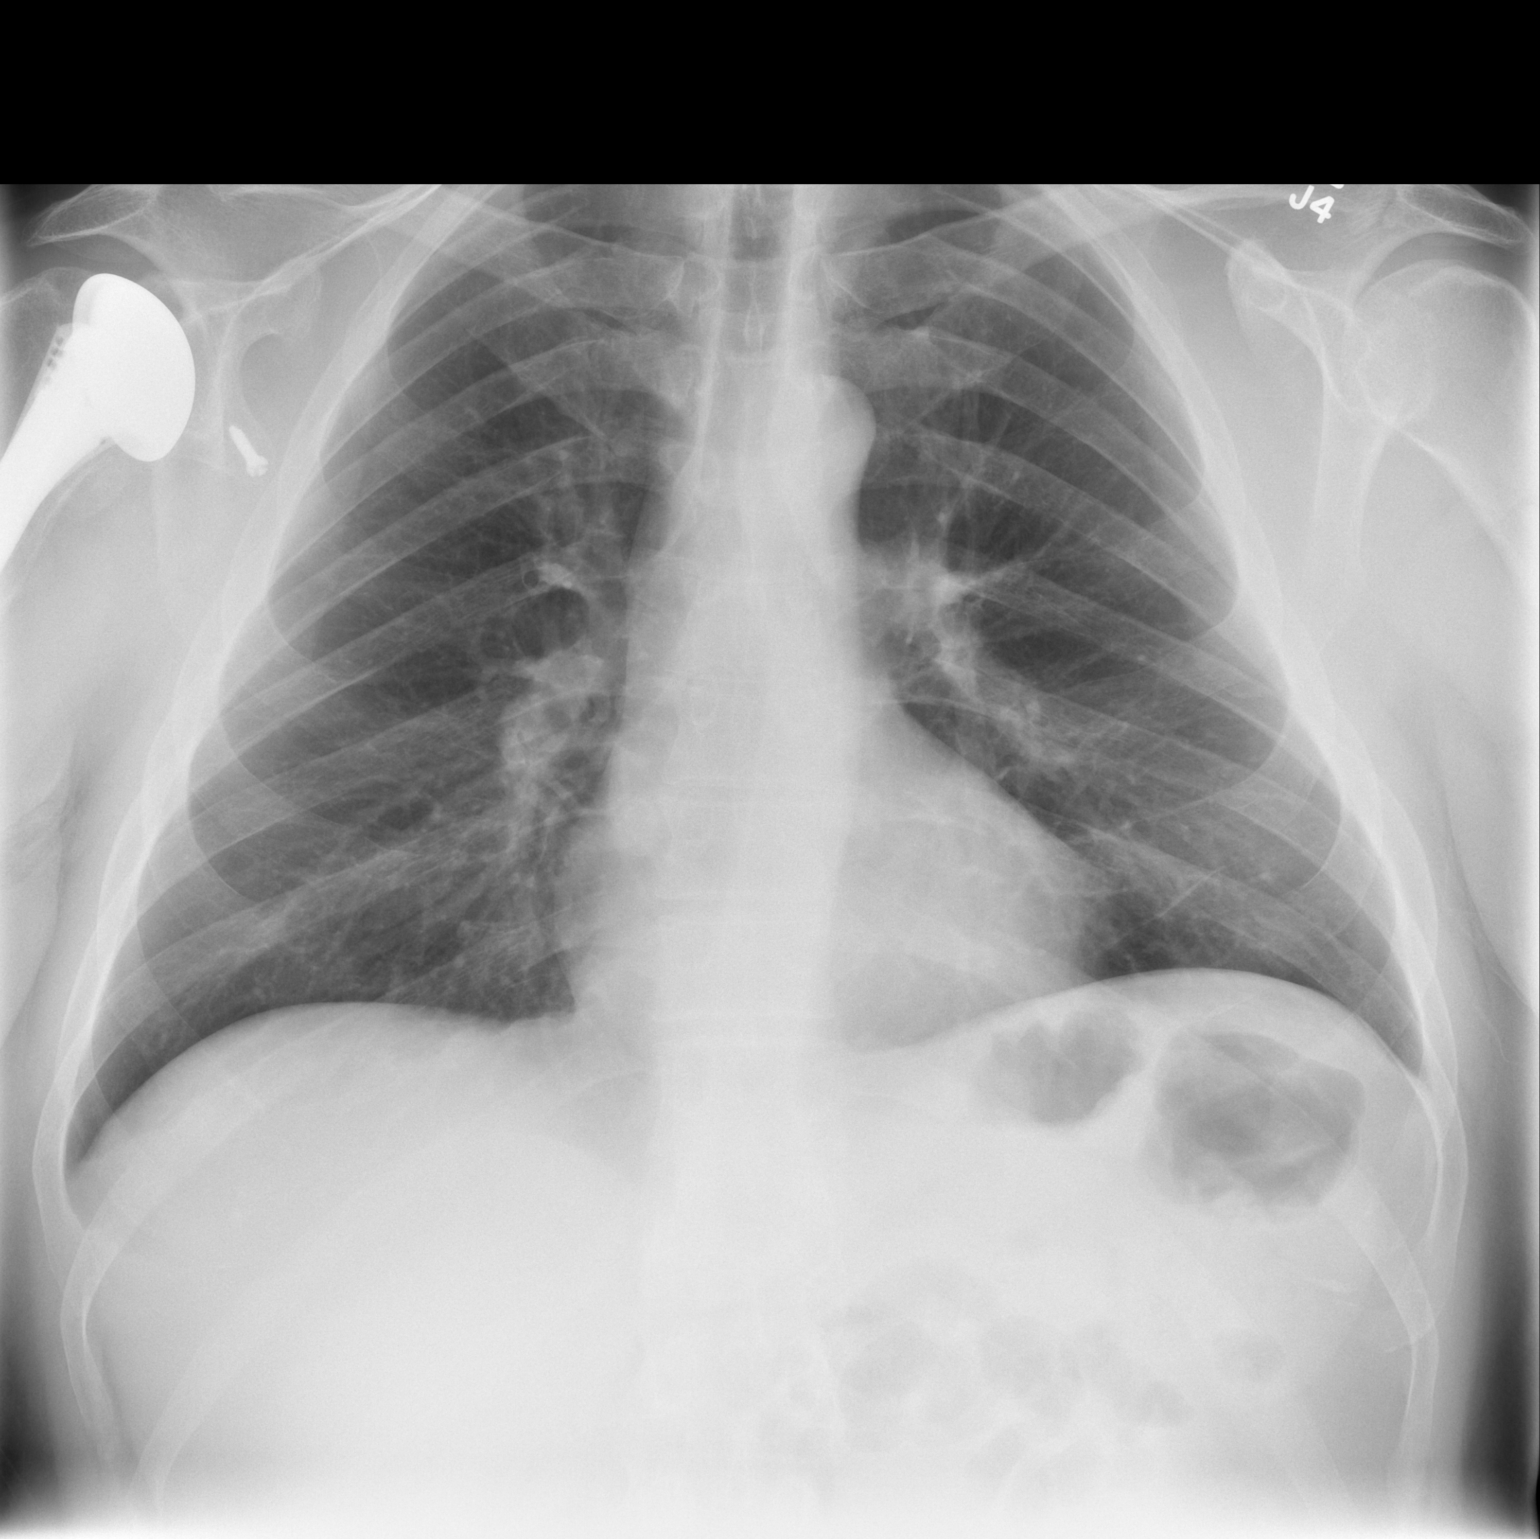

[w chest lat]
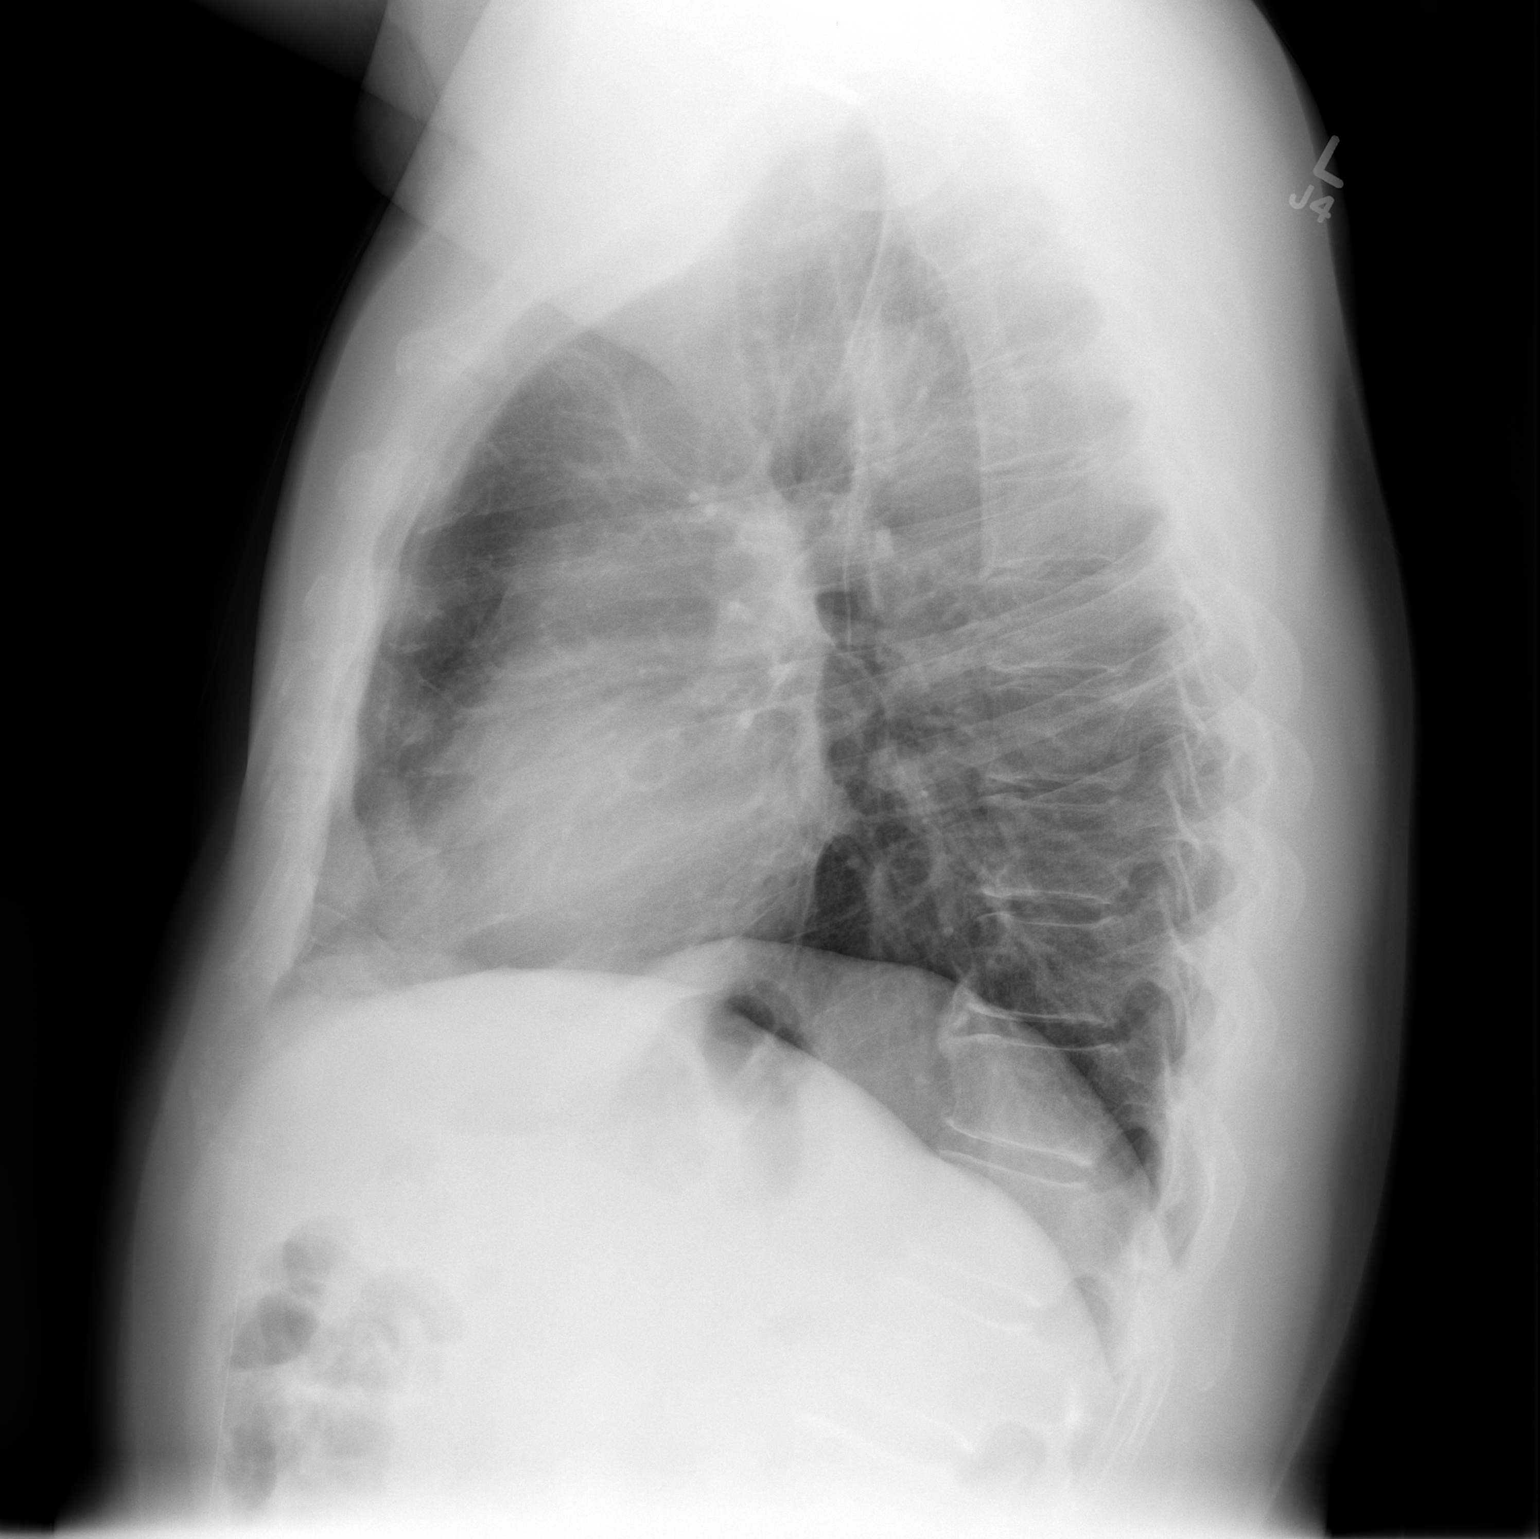

[2 of 2 positions shown; findings below may reference images not displayed]

FINDINGS: Heart size and vascularity are normal.  Lungs are clear
without infiltrate or effusion.  Negative for mass lesion.  Right
shoulder hemiarthroplasty.
IMPRESSION: No active cardiopulmonary abnormality.

## 2015-01-01 IMAGING — CR DG SHOULDER 2+V PORT*R*
1 series · 1 of 1 positions shown · non-contrast
Comparison: None.

CLINICAL DATA: Postop evaluation

EXAM:
PORTABLE RIGHT SHOULDER - 2+ VIEW

[AP]
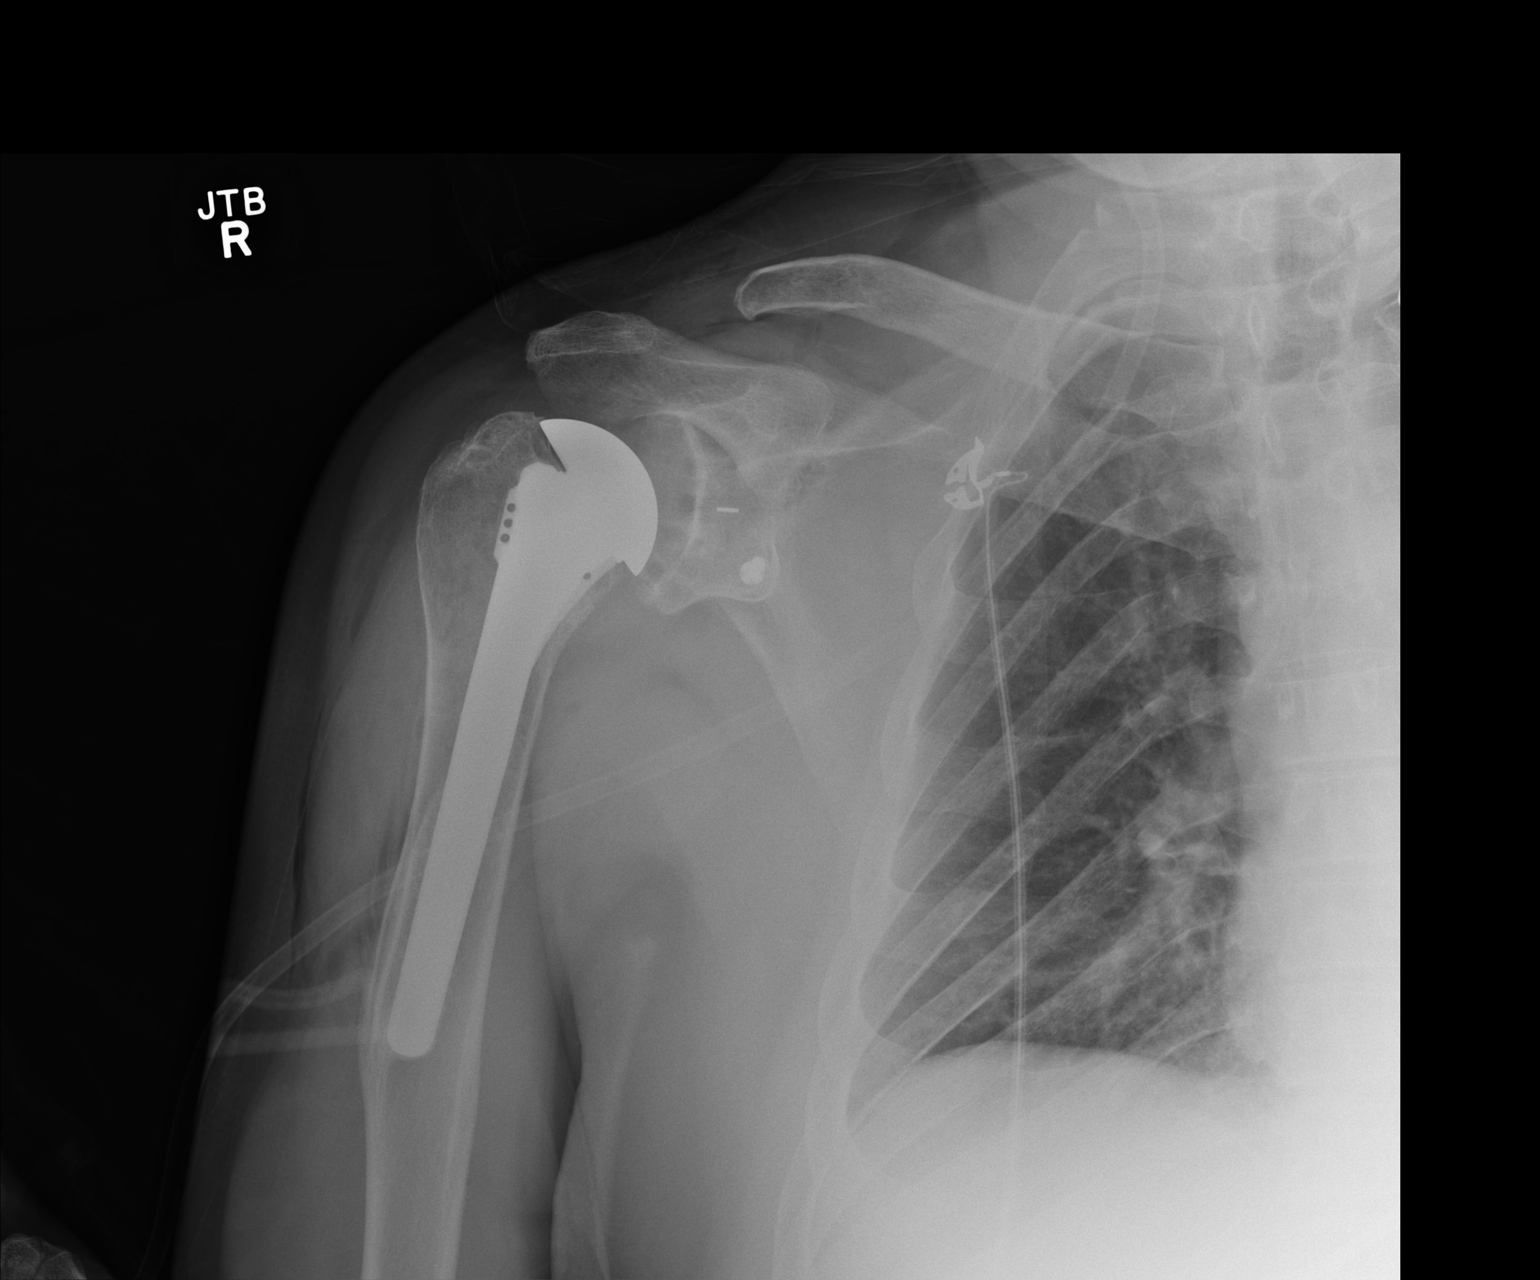

[1 of 1 positions shown; findings below may reference images not displayed]

FINDINGS: A right shoulder arthroplasty is appreciated. Hardware appears
intact. Native osseous structures unremarkable. Surgical drains
appreciated.
IMPRESSION: Patient is status post right shoulder arthroplasty.

## 2020-04-09 ENCOUNTER — Other Ambulatory Visit: Payer: Self-pay | Admitting: Orthopedic Surgery

## 2020-04-09 DIAGNOSIS — M25511 Pain in right shoulder: Secondary | ICD-10-CM

## 2020-04-09 DIAGNOSIS — M67911 Unspecified disorder of synovium and tendon, right shoulder: Secondary | ICD-10-CM

## 2020-04-24 ENCOUNTER — Ambulatory Visit
Admission: RE | Admit: 2020-04-24 | Discharge: 2020-04-24 | Disposition: A | Discharge status: Home / Self Care | Payer: 59 | Source: Ambulatory Visit | Attending: Orthopedic Surgery | Admitting: Orthopedic Surgery

## 2020-04-24 ENCOUNTER — Other Ambulatory Visit: Payer: Self-pay

## 2020-04-24 DIAGNOSIS — M25511 Pain in right shoulder: Secondary | ICD-10-CM

## 2020-04-24 DIAGNOSIS — M67911 Unspecified disorder of synovium and tendon, right shoulder: Secondary | ICD-10-CM

## 2020-04-24 MED ORDER — IOPAMIDOL (ISOVUE-M 200) INJECTION 41%
20.0000 mL | Freq: Once | INTRAMUSCULAR | Status: AC
  Administered 2020-04-24: 20 mL via INTRA_ARTICULAR

## 2020-12-08 DIAGNOSIS — M25572 Pain in left ankle and joints of left foot: Secondary | ICD-10-CM | POA: Diagnosis not present

## 2020-12-08 DIAGNOSIS — M25561 Pain in right knee: Secondary | ICD-10-CM | POA: Diagnosis not present

## 2020-12-08 DIAGNOSIS — M25552 Pain in left hip: Secondary | ICD-10-CM | POA: Diagnosis not present

## 2020-12-08 DIAGNOSIS — R52 Pain, unspecified: Secondary | ICD-10-CM | POA: Diagnosis not present

## 2020-12-10 DIAGNOSIS — I10 Essential (primary) hypertension: Secondary | ICD-10-CM | POA: Diagnosis not present

## 2020-12-10 DIAGNOSIS — R7309 Other abnormal glucose: Secondary | ICD-10-CM | POA: Diagnosis not present

## 2020-12-10 DIAGNOSIS — N529 Male erectile dysfunction, unspecified: Secondary | ICD-10-CM | POA: Diagnosis not present

## 2020-12-10 DIAGNOSIS — E7800 Pure hypercholesterolemia, unspecified: Secondary | ICD-10-CM | POA: Diagnosis not present

## 2020-12-22 DIAGNOSIS — R351 Nocturia: Secondary | ICD-10-CM | POA: Diagnosis not present

## 2020-12-22 DIAGNOSIS — I10 Essential (primary) hypertension: Secondary | ICD-10-CM | POA: Diagnosis not present

## 2020-12-22 DIAGNOSIS — Z0001 Encounter for general adult medical examination with abnormal findings: Secondary | ICD-10-CM | POA: Diagnosis not present

## 2020-12-22 DIAGNOSIS — R9431 Abnormal electrocardiogram [ECG] [EKG]: Secondary | ICD-10-CM | POA: Diagnosis not present

## 2020-12-22 DIAGNOSIS — M25561 Pain in right knee: Secondary | ICD-10-CM | POA: Diagnosis not present

## 2020-12-22 DIAGNOSIS — R7301 Impaired fasting glucose: Secondary | ICD-10-CM | POA: Diagnosis not present

## 2020-12-22 DIAGNOSIS — M25572 Pain in left ankle and joints of left foot: Secondary | ICD-10-CM | POA: Diagnosis not present

## 2020-12-22 DIAGNOSIS — M545 Low back pain, unspecified: Secondary | ICD-10-CM | POA: Diagnosis not present

## 2021-01-06 DIAGNOSIS — N5201 Erectile dysfunction due to arterial insufficiency: Secondary | ICD-10-CM | POA: Diagnosis not present

## 2021-01-06 DIAGNOSIS — N401 Enlarged prostate with lower urinary tract symptoms: Secondary | ICD-10-CM | POA: Diagnosis not present

## 2021-01-06 DIAGNOSIS — N138 Other obstructive and reflux uropathy: Secondary | ICD-10-CM | POA: Diagnosis not present

## 2021-01-06 DIAGNOSIS — R972 Elevated prostate specific antigen [PSA]: Secondary | ICD-10-CM | POA: Diagnosis not present

## 2021-02-01 DIAGNOSIS — Z96652 Presence of left artificial knee joint: Secondary | ICD-10-CM | POA: Diagnosis not present

## 2021-02-01 DIAGNOSIS — M7062 Trochanteric bursitis, left hip: Secondary | ICD-10-CM | POA: Diagnosis not present

## 2021-02-01 DIAGNOSIS — Z96651 Presence of right artificial knee joint: Secondary | ICD-10-CM | POA: Diagnosis not present

## 2021-02-02 ENCOUNTER — Other Ambulatory Visit: Payer: Self-pay | Admitting: Orthopaedic Surgery

## 2021-02-02 ENCOUNTER — Other Ambulatory Visit (HOSPITAL_COMMUNITY): Payer: Self-pay | Admitting: Orthopaedic Surgery

## 2021-02-02 DIAGNOSIS — Z96651 Presence of right artificial knee joint: Secondary | ICD-10-CM

## 2021-02-02 DIAGNOSIS — M25561 Pain in right knee: Secondary | ICD-10-CM

## 2021-02-05 ENCOUNTER — Encounter (HOSPITAL_COMMUNITY)
Admission: RE | Admit: 2021-02-05 | Discharge: 2021-02-05 | Disposition: A | Discharge status: Home / Self Care | Payer: BC Managed Care – PPO | Source: Ambulatory Visit | Attending: Orthopaedic Surgery | Admitting: Orthopaedic Surgery

## 2021-02-05 ENCOUNTER — Other Ambulatory Visit: Payer: Self-pay

## 2021-02-05 DIAGNOSIS — Z96651 Presence of right artificial knee joint: Secondary | ICD-10-CM | POA: Insufficient documentation

## 2021-02-05 DIAGNOSIS — M25561 Pain in right knee: Secondary | ICD-10-CM | POA: Diagnosis not present

## 2021-02-05 MED ORDER — TECHNETIUM TC 99M MEDRONATE IV KIT
20.0000 | PACK | Freq: Once | INTRAVENOUS | Status: AC | PRN
  Administered 2021-02-05: 20 via INTRAVENOUS

## 2021-02-08 DIAGNOSIS — T8484XA Pain due to internal orthopedic prosthetic devices, implants and grafts, initial encounter: Secondary | ICD-10-CM | POA: Diagnosis not present

## 2021-02-08 DIAGNOSIS — Z96651 Presence of right artificial knee joint: Secondary | ICD-10-CM | POA: Diagnosis not present

## 2021-02-11 ENCOUNTER — Other Ambulatory Visit: Payer: Self-pay | Admitting: Orthopaedic Surgery

## 2021-02-11 DIAGNOSIS — Z01818 Encounter for other preprocedural examination: Secondary | ICD-10-CM

## 2021-02-25 NOTE — Progress Notes (Signed)
COVID Vaccine Completed: Date COVID Vaccine completed: Has received booster: COVID vaccine manufacturer: Pfizer    [facility] [employer]   Date of COVID positive in last 90 days:  PCP - Forrest Moron Cardiologist -   Chest x-ray - 03/02/21 EKG - 03/02/21 Stress Test -  ECHO -  Cardiac Cath -  Pacemaker/ICD device last checked: Spinal Cord Stimulator:  Sleep Study -  CPAP -   Fasting Blood Sugar -  Checks Blood Sugar _____ times a day  Blood Thinner Instructions: Aspirin Instructions: Last Dose:  Activity level:  Can go up a flight of stairs and perform activities of daily living without stopping and without symptoms of chest pain or shortness of breath.   Able to exercise without symptoms  Unable to go up a flight of stairs without symptoms of      Anesthesia review:   Patient denies shortness of breath, fever, cough and chest pain at PAT appointment   Patient verbalized understanding of instructions that were given to them at the PAT appointment. Patient was also instructed that they will need to review over the PAT instructions again at home before surgery.

## 2021-02-25 NOTE — Patient Instructions (Addendum)
DUE TO COVID-19 ONLY ONE VISITOR IS ALLOWED TO COME WITH YOU AND STAY IN THE WAITING ROOM ONLY DURING PRE OP AND PROCEDURE.   **NO VISITORS ARE ALLOWED IN THE SHORT STAY AREA OR RECOVERY ROOM!!**  IF YOU WILL BE ADMITTED INTO THE HOSPITAL YOU ARE ALLOWED ONLY TWO SUPPORT PEOPLE DURING VISITATION HOURS ONLY (10AM -8PM)   . The support person(s) may change daily. . The support person(s) must pass our screening, gel in and out, and wear a mask at all times, including in the patient's room. . Patients must also wear a mask when staff or their support person are in the room.  No visitors under the age of 59. Any visitor under the age of 85 must be accompanied by an adult.    COVID SWAB TESTING MUST BE COMPLETED ON:  03/05/21 @ 2:05 PM   [address] [place], [place] [address]   . You are not required to quarantine, however you are required to wear a well-fitted mask when you are out and around people not in your household.  [facility] Rile Hygiene often . Do NOT share personal items . Notify your provider if you are in close contact with someone who has COVID or you develop fever 100.4 or greater, new onset of sneezing, cough, sore throat, shortness of breath or body aches.        Your procedure is scheduled on: 03/09/21   Report to [facility] Main  Entrance    Report to admitting at 7:30 AM   Call this number if you have problems the morning of surgery [phone]   Do not eat food :After Midnight.   May have liquids until  7:00 AM  day of surgery  CLEAR LIQUID DIET  Foods Allowed                                                                     Foods Excluded  Water, Black Coffee and tea, regular and decaf             liquids that you cannot  Plain Jell-O in any flavor  (No red)                                    see through such as: Fruit ices (not with fruit pulp)                                            milk, soups, orange juice              Iced Popsicles (No red)                                                 All solid food  Apple juices Sports drinks like Gatorade (No red) Lightly seasoned clear broth or consume(fat free) Sugar, honey syrup    Complete one Ensure drink the morning of surgery at 7:00 AM  the day of surgery.     1. The day of surgery:  ? Drink ONE (1) Pre-Surgery Clear Ensure or G2 by am the morning of surgery. Drink in one sitting. Do not sip.  ? This drink was given to you during your hospital  pre-op appointment visit. ? Nothing else to drink after completing the  Pre-Surgery Clear Ensure or G2.          If you have questions, please contact your surgeon's office.     Oral Hygiene is also important to reduce your risk of infection.                                    Remember - BRUSH YOUR TEETH THE MORNING OF SURGERY WITH YOUR REGULAR TOOTHPASTE   Do NOT smoke after Midnight   Take these medicines the morning of surgery with A SIP OF WATER: Duloxetine, Metoprolol Succinate, Tramadol                               You may not have any metal on your body including jewelry, and body piercings             Do not wear lotions, powders, cologne, or deodorant              Men may shave face and neck.   Do not bring valuables to the hospital. [facility] IS NOT             RESPONSIBLE   FOR VALUABLES.   Contacts, dentures or bridgework may not be worn into surgery.   Bring small overnight bag day of surgery.   Special Instructions: Bring a copy of your healthcare power of attorney and living will documents         the day of surgery if you haven't  scanned them in before.              Please read over the following fact sheets you were given: IF YOU HAVE QUESTIONS ABOUT YOUR PRE OP INSTRUCTIONS PLEASE CALL  [phone] [facility] - Preparing for Surgery Before surgery, you can play an important role.  Because skin is not sterile, your skin needs to be as free of germs as possible.  You can  reduce the number of germs on your skin by washing with CHG (chlorahexidine gluconate) soap before surgery.  CHG is an antiseptic cleaner which kills germs and bonds with the skin to continue killing germs even after washing. Please DO NOT use if you have an allergy to CHG or antibacterial soaps.  If your skin becomes reddened/irritated stop using the CHG and inform your nurse when you arrive at Short Stay. Do not shave (including legs and underarms) for at least 48 hours prior to the first CHG shower.  You may shave your face/neck.  Please follow these instructions carefully:  1.  Shower with CHG Soap the night before surgery and the  morning of surgery.  2.  If you choose to wash your hair, wash your hair first as usual with your normal  shampoo.  3.  After you shampoo, rinse your hair and body  thoroughly to remove the shampoo.                             4.  Use CHG as you would any other liquid soap.  You can apply chg directly to the skin and wash.  Gently with a scrungie or clean washcloth.  5.  Apply the CHG Soap to your body ONLY FROM THE NECK DOWN.   Do   not use on face/ open                           Wound or open sores. Avoid contact with eyes, ears mouth and   genitals (private parts).                       Wash face,  Genitals (private parts) with your normal soap.             6.  Wash thoroughly, paying special attention to the area where your    surgery  will be performed.  7.  Thoroughly rinse your body with warm water from the neck down.  8.  DO NOT shower/wash with your normal soap after using and rinsing off the CHG Soap.                9.  Pat yourself dry with a clean towel.            10.  Wear clean pajamas.            11.  Place clean sheets on your bed the night of your first shower and do not  sleep with pets. Day of Surgery : Do not apply any lotions/deodorants the morning of surgery.  Please wear clean clothes to the hospital/surgery center.  FAILURE TO FOLLOW THESE  INSTRUCTIONS MAY RESULT IN THE CANCELLATION OF YOUR SURGERY  PATIENT SIGNATURE_________________________________  NURSE SIGNATURE__________________________________  ________________________________________________________________________   Cesar Davis  An incentive spirometer is a tool that can help keep your lungs clear and active. This tool measures how well you are filling your lungs with each breath. Taking long deep breaths may help reverse or decrease the chance of developing breathing (pulmonary) problems (especially infection) following:  A long period of time when you are unable to move or be active. BEFORE THE PROCEDURE   If the spirometer includes an indicator to show your best effort, your nurse or respiratory therapist will set it to a desired goal.  If possible, sit up straight or lean slightly forward. Try not to slouch.  Hold the incentive spirometer in an upright position. INSTRUCTIONS FOR USE  1. Sit on the edge of your bed if possible, or sit up as far as you can in bed or on a chair. 2. Hold the incentive spirometer in an upright position. 3. Breathe out normally. 4. Place the mouthpiece in your mouth and seal your lips tightly around it. 5. Breathe in slowly and as deeply as possible, raising the piston or the ball toward the top of the column. 6. Hold your breath for 3-5 seconds or for as long as possible. Allow the piston or ball to fall to the bottom of the column. 7. Remove the mouthpiece from your mouth and breathe out normally. 8. Rest for a few seconds and repeat Steps 1 through 7 at least 10 times every 1-2 hours when you are awake. Take your time  and take a few normal breaths between deep breaths. 9. The spirometer may include an indicator to show your best effort. Use the indicator as a goal to work toward during each repetition. 10. After each set of 10 deep breaths, practice coughing to be sure your lungs are clear. If you have an incision (the  cut made at the time of surgery), support your incision when coughing by placing a pillow or rolled up towels firmly against it. Once you are able to get out of bed, walk around indoors and cough well. You may stop using the incentive spirometer when instructed by your caregiver.  RISKS AND COMPLICATIONS  Take your time so you do not get dizzy or light-headed.  If you are in pain, you may need to take or ask for pain medication before doing incentive spirometry. It is harder to take a deep breath if you are having pain. AFTER USE  Rest and breathe slowly and easily.  It can be helpful to keep track of a log of your progress. Your caregiver can provide you with a simple table to help with this. If you are using the spirometer at home, follow these instructions: SEEK MEDICAL CARE IF:   You are having difficultly using the spirometer.  You have trouble using the spirometer as often as instructed.  Your pain medication is not giving enough relief while using the spirometer.  You develop fever of 100.5 F (38.1 C) or higher. SEEK IMMEDIATE MEDICAL CARE IF:   You cough up bloody sputum that had not been present before.  You develop fever of 102 F (38.9 C) or greater.  You develop worsening pain at or near the incision site. MAKE SURE YOU:   Understand these instructions.  Will watch your condition.  Will get help right away if you are not doing well or get worse. Document Released: 01/30/2007 Document Revised: 12/12/2011 Document Reviewed: 04/02/2007 ExitCare Patient Information 2014 ExitCare, [place].   ________________________________________________________________________  WHAT IS A BLOOD TRANSFUSION? Blood Transfusion Information  A transfusion is the replacement of blood or some of its parts. Blood is made up of multiple cells which provide different functions.  Red blood cells carry oxygen and are used for blood loss replacement.  White blood cells fight against  infection.  Platelets control bleeding.  Plasma helps clot blood.  Other blood products are available for specialized needs, such as hemophilia or other clotting disorders. BEFORE THE TRANSFUSION  Who gives blood for transfusions?   Healthy volunteers who are fully evaluated to make sure their blood is safe. This is blood bank blood. Transfusion therapy is the safest it has ever been in the practice of medicine. Before blood is taken from a donor, a complete history is taken to make sure that person has no history of diseases nor engages in risky social behavior (examples are intravenous drug use or sexual activity with multiple partners). The donor's travel history is screened to minimize risk of transmitting infections, such as malaria. The donated blood is tested for signs of infectious diseases, such as HIV and hepatitis. The blood is then tested to be sure it is compatible with you in order to minimize the chance of a transfusion reaction. If you or a relative donates blood, this is often done in anticipation of surgery and is not appropriate for emergency situations. It takes many days to process the donated blood. RISKS AND COMPLICATIONS Although transfusion therapy is very safe and saves many lives, the main dangers of transfusion  include:   Getting an infectious disease.  Developing a transfusion reaction. This is an allergic reaction to something in the blood you were given. Every precaution is taken to prevent this. The decision to have a blood transfusion has been considered carefully by your caregiver before blood is given. Blood is not given unless the benefits outweigh the risks. AFTER THE TRANSFUSION  Right after receiving a blood transfusion, you will usually feel much better and more energetic. This is especially true if your red blood cells have gotten low (anemic). The transfusion raises the level of the red blood cells which carry oxygen, and this usually causes an energy  increase.  The nurse administering the transfusion will monitor you carefully for complications. HOME CARE INSTRUCTIONS  No special instructions are needed after a transfusion. You may find your energy is better. Speak with your caregiver about any limitations on activity for underlying diseases you may have. SEEK MEDICAL CARE IF:   Your condition is not improving after your transfusion.  You develop redness or irritation at the intravenous (IV) site. SEEK IMMEDIATE MEDICAL CARE IF:  Any of the following symptoms occur over the next 12 hours:  Shaking chills.  You have a temperature by mouth above 102 F (38.9 C), not controlled by medicine.  Chest, back, or muscle pain.  People around you feel you are not acting correctly or are confused.  Shortness of breath or difficulty breathing.  Dizziness and fainting.  You get a rash or develop hives.  You have a decrease in urine output.  Your urine turns a dark color or changes to pink, red, or brown. Any of the following symptoms occur over the next 10 days:  You have a temperature by mouth above 102 F (38.9 C), not controlled by medicine.  Shortness of breath.  Weakness after normal activity.  The white part of the eye turns yellow (jaundice).  You have a decrease in the amount of urine or are urinating less often.  Your urine turns a dark color or changes to pink, red, or brown. Document Released: 09/16/2000 Document Revised: 12/12/2011 Document Reviewed: 05/05/2008 [facility] Patient Information 2014 [place], [place].  _______________________________________________________________________

## 2021-03-02 ENCOUNTER — Ambulatory Visit (HOSPITAL_COMMUNITY)
Admission: RE | Admit: 2021-03-02 | Discharge: 2021-03-02 | Disposition: A | Discharge status: Home / Self Care | Payer: BC Managed Care – PPO | Source: Ambulatory Visit | Attending: Orthopaedic Surgery | Admitting: Orthopaedic Surgery

## 2021-03-02 ENCOUNTER — Encounter (HOSPITAL_COMMUNITY): Payer: Self-pay

## 2021-03-02 ENCOUNTER — Encounter (HOSPITAL_COMMUNITY)
Admission: RE | Admit: 2021-03-02 | Discharge: 2021-03-02 | Disposition: A | Discharge status: Home / Self Care | Payer: BC Managed Care – PPO | Source: Ambulatory Visit | Attending: Orthopaedic Surgery | Admitting: Orthopaedic Surgery

## 2021-03-02 ENCOUNTER — Other Ambulatory Visit: Payer: Self-pay

## 2021-03-02 ENCOUNTER — Encounter (INDEPENDENT_AMBULATORY_CARE_PROVIDER_SITE_OTHER): Payer: Self-pay

## 2021-03-02 DIAGNOSIS — Z01818 Encounter for other preprocedural examination: Secondary | ICD-10-CM | POA: Diagnosis not present

## 2021-03-02 HISTORY — DX: Prediabetes: R73.03

## 2021-03-02 LAB — CBC WITH DIFFERENTIAL/PLATELET
Abs Immature Granulocytes: 0.04 10*3/uL (ref 0.00–0.07)
Basophils Absolute: 0.1 10*3/uL (ref 0.0–0.1)
Basophils Relative: 1 %
Eosinophils Absolute: 0.1 10*3/uL (ref 0.0–0.5)
Eosinophils Relative: 1 %
HCT: 50.8 % (ref 39.0–52.0)
Hemoglobin: 16.7 g/dL (ref 13.0–17.0)
Immature Granulocytes: 1 %
Lymphocytes Relative: 20 %
Lymphs Abs: 1.7 10*3/uL (ref 0.7–4.0)
MCH: 28 pg (ref 26.0–34.0)
MCHC: 32.9 g/dL (ref 30.0–36.0)
MCV: 85.2 fL (ref 80.0–100.0)
Monocytes Absolute: 0.6 10*3/uL (ref 0.1–1.0)
Monocytes Relative: 7 %
Neutro Abs: 6.3 10*3/uL (ref 1.7–7.7)
Neutrophils Relative %: 70 %
Platelets: 358 10*3/uL (ref 150–400)
RBC: 5.96 MIL/uL — ABNORMAL HIGH (ref 4.22–5.81)
RDW: 12.9 % (ref 11.5–15.5)
WBC: 8.9 10*3/uL (ref 4.0–10.5)
nRBC: 0 % (ref 0.0–0.2)

## 2021-03-02 LAB — URINALYSIS, ROUTINE W REFLEX MICROSCOPIC
Bilirubin Urine: NEGATIVE
Glucose, UA: NEGATIVE mg/dL
Hgb urine dipstick: NEGATIVE
Ketones, ur: NEGATIVE mg/dL
Leukocytes,Ua: NEGATIVE
Nitrite: NEGATIVE
Protein, ur: NEGATIVE mg/dL
Specific Gravity, Urine: 1.016 (ref 1.005–1.030)
pH: 6 (ref 5.0–8.0)

## 2021-03-02 LAB — BASIC METABOLIC PANEL
Anion gap: 10 (ref 5–15)
BUN: 19 mg/dL (ref 8–23)
CO2: 23 mmol/L (ref 22–32)
Calcium: 9.6 mg/dL (ref 8.9–10.3)
Chloride: 103 mmol/L (ref 98–111)
Creatinine, Ser: 0.95 mg/dL (ref 0.61–1.24)
GFR, Estimated: >60 mL/min (ref >60)
Glucose, Bld: 107 mg/dL — ABNORMAL HIGH (ref 70–99)
Potassium: 4.2 mmol/L (ref 3.5–5.1)
Sodium: 136 mmol/L (ref 135–145)

## 2021-03-02 LAB — PROTIME-INR
INR: 0.9 (ref 0.8–1.2)
Prothrombin Time: 12.5 seconds (ref 11.4–15.2)

## 2021-03-02 LAB — SURGICAL PCR SCREEN
MRSA, PCR: NEGATIVE
Staphylococcus aureus: POSITIVE — AB

## 2021-03-02 LAB — APTT: aPTT: 33 seconds (ref 24–36)

## 2021-03-02 NOTE — Progress Notes (Addendum)
COVID Vaccine Completed:  Yes x3 Date COVID Vaccine completed: Has received booster: 09-2020 COVID vaccine manufacturer: Pfizer    [employer]   Date of COVID positive in last 90 days:  N/A  PCP - Forrest Moron, MD Cardiologist - N/A  Chest x-ray - 03/02/21 EKG - 12-22-20 On chart Stress Test - N/A ECHO - N/A Cardiac Cath - N/A Pacemaker/ICD device last checked: Spinal Cord Stimulator:  Sleep Study - N/A CPAP -   Fasting Blood Sugar - N/A (prediabetic) Checks Blood Sugar _____ times a day  Blood Thinner Instructions: N/A Aspirin Instructions: Last Dose:  Activity level:   Can go up a flight of stairs and perform activities of daily living without stopping and without symptoms of chest pain or shortness of breath.                                              Anesthesia review:  N/A  Patient denies shortness of breath, fever, cough and chest pain at PAT appointment   Patient verbalized understanding of instructions that were given to them at the PAT appointment. Patient was also instructed that they will need to review over the PAT instructions again at home before surgery.

## 2021-03-02 NOTE — Progress Notes (Signed)
PCR results sent to Dr. Jerl Santos.

## 2021-03-03 LAB — HEMOGLOBIN A1C
Hgb A1c MFr Bld: 6 % — ABNORMAL HIGH (ref 4.8–5.6)
Mean Plasma Glucose: 126 mg/dL

## 2021-03-04 DIAGNOSIS — W010XXA Fall on same level from slipping, tripping and stumbling without subsequent striking against object, initial encounter: Secondary | ICD-10-CM | POA: Diagnosis not present

## 2021-03-04 DIAGNOSIS — S4991XA Unspecified injury of right shoulder and upper arm, initial encounter: Secondary | ICD-10-CM | POA: Diagnosis not present

## 2021-03-04 DIAGNOSIS — M9731XA Periprosthetic fracture around internal prosthetic right shoulder joint, initial encounter: Secondary | ICD-10-CM | POA: Diagnosis not present

## 2021-03-04 DIAGNOSIS — S42301A Unspecified fracture of shaft of humerus, right arm, initial encounter for closed fracture: Secondary | ICD-10-CM | POA: Diagnosis not present

## 2021-03-04 DIAGNOSIS — Z471 Aftercare following joint replacement surgery: Secondary | ICD-10-CM | POA: Diagnosis not present

## 2021-03-04 DIAGNOSIS — Y999 Unspecified external cause status: Secondary | ICD-10-CM | POA: Diagnosis not present

## 2021-03-05 ENCOUNTER — Other Ambulatory Visit (HOSPITAL_COMMUNITY): Payer: BC Managed Care – PPO

## 2021-03-05 DIAGNOSIS — M25511 Pain in right shoulder: Secondary | ICD-10-CM | POA: Diagnosis not present

## 2021-03-05 DIAGNOSIS — M9731XA Periprosthetic fracture around internal prosthetic right shoulder joint, initial encounter: Secondary | ICD-10-CM | POA: Diagnosis not present

## 2021-03-08 NOTE — Addendum Note (Signed)
Encounter addended by: Kashmir Leedy, [place] Z on: 03/08/2021 8:42 AM  Actions taken: Imaging Exam ended, Charge Capture section accepted

## 2021-03-09 ENCOUNTER — Encounter (HOSPITAL_COMMUNITY): Admission: RE | Payer: Self-pay | Source: Home / Self Care

## 2021-03-09 ENCOUNTER — Inpatient Hospital Stay (HOSPITAL_COMMUNITY)
Admission: RE | Admit: 2021-03-09 | Payer: BC Managed Care – PPO | Source: Home / Self Care | Admitting: Orthopaedic Surgery

## 2021-03-09 LAB — TYPE AND SCREEN
ABO/RH(D): O POS
Antibody Screen: NEGATIVE

## 2021-03-09 SURGERY — TOTAL KNEE REVISION
Anesthesia: Spinal | Site: Knee | Laterality: Right

## 2021-03-10 ENCOUNTER — Other Ambulatory Visit: Payer: Self-pay | Admitting: Orthopedic Surgery

## 2021-03-10 DIAGNOSIS — M9731XA Periprosthetic fracture around internal prosthetic right shoulder joint, initial encounter: Secondary | ICD-10-CM | POA: Diagnosis not present

## 2021-03-10 DIAGNOSIS — Z01811 Encounter for preprocedural respiratory examination: Secondary | ICD-10-CM

## 2021-03-10 DIAGNOSIS — M25511 Pain in right shoulder: Secondary | ICD-10-CM | POA: Diagnosis not present

## 2021-03-10 NOTE — Progress Notes (Signed)
Please enter orders for surgery 03-11-21.

## 2021-03-10 NOTE — Progress Notes (Signed)
Spoke to patient's wife and gave arrival time for surgery 03-11-21, NPO status and instructions.  Patient's wife stated understanding.  She is unsure if he went for Covid test today but will arrive three hours early if not done.

## 2021-03-11 ENCOUNTER — Encounter (HOSPITAL_COMMUNITY): Payer: Self-pay | Admitting: Orthopedic Surgery

## 2021-03-11 ENCOUNTER — Encounter (HOSPITAL_COMMUNITY)
Admission: RE | Disposition: A | Discharge status: Home / Self Care | Payer: Self-pay | Source: Home / Self Care | Attending: Orthopedic Surgery

## 2021-03-11 ENCOUNTER — Observation Stay (HOSPITAL_COMMUNITY)
Admission: RE | Admit: 2021-03-11 | Discharge: 2021-03-12 | Disposition: A | Discharge status: Home / Self Care | Payer: BC Managed Care – PPO | Attending: Orthopedic Surgery | Admitting: Orthopedic Surgery

## 2021-03-11 ENCOUNTER — Ambulatory Visit (HOSPITAL_COMMUNITY): Payer: BC Managed Care – PPO

## 2021-03-11 ENCOUNTER — Ambulatory Visit (HOSPITAL_COMMUNITY): Payer: BC Managed Care – PPO | Admitting: Certified Registered Nurse Anesthetist

## 2021-03-11 ENCOUNTER — Other Ambulatory Visit: Payer: Self-pay

## 2021-03-11 DIAGNOSIS — W19XXXA Unspecified fall, initial encounter: Secondary | ICD-10-CM | POA: Insufficient documentation

## 2021-03-11 DIAGNOSIS — Z8781 Personal history of (healed) traumatic fracture: Secondary | ICD-10-CM

## 2021-03-11 DIAGNOSIS — M9689 Other intraoperative and postprocedural complications and disorders of the musculoskeletal system: Secondary | ICD-10-CM | POA: Diagnosis not present

## 2021-03-11 DIAGNOSIS — I10 Essential (primary) hypertension: Secondary | ICD-10-CM | POA: Insufficient documentation

## 2021-03-11 DIAGNOSIS — Z96651 Presence of right artificial knee joint: Secondary | ICD-10-CM | POA: Diagnosis not present

## 2021-03-11 DIAGNOSIS — M9731XA Periprosthetic fracture around internal prosthetic right shoulder joint, initial encounter: Secondary | ICD-10-CM | POA: Diagnosis not present

## 2021-03-11 DIAGNOSIS — Z87891 Personal history of nicotine dependence: Secondary | ICD-10-CM | POA: Insufficient documentation

## 2021-03-11 DIAGNOSIS — M1711 Unilateral primary osteoarthritis, right knee: Secondary | ICD-10-CM | POA: Diagnosis not present

## 2021-03-11 DIAGNOSIS — Z79899 Other long term (current) drug therapy: Secondary | ICD-10-CM | POA: Insufficient documentation

## 2021-03-11 DIAGNOSIS — Z20822 Contact with and (suspected) exposure to covid-19: Secondary | ICD-10-CM | POA: Insufficient documentation

## 2021-03-11 DIAGNOSIS — Z9889 Other specified postprocedural states: Secondary | ICD-10-CM

## 2021-03-11 DIAGNOSIS — G8918 Other acute postprocedural pain: Secondary | ICD-10-CM | POA: Diagnosis not present

## 2021-03-11 DIAGNOSIS — Z96611 Presence of right artificial shoulder joint: Secondary | ICD-10-CM | POA: Insufficient documentation

## 2021-03-11 DIAGNOSIS — Z419 Encounter for procedure for purposes other than remedying health state, unspecified: Secondary | ICD-10-CM

## 2021-03-11 DIAGNOSIS — R7303 Prediabetes: Secondary | ICD-10-CM | POA: Insufficient documentation

## 2021-03-11 DIAGNOSIS — Z01811 Encounter for preprocedural respiratory examination: Secondary | ICD-10-CM

## 2021-03-11 HISTORY — PX: ORIF HUMERUS FRACTURE: SHX2126

## 2021-03-11 LAB — SARS CORONAVIRUS 2 BY RT PCR (HOSPITAL ORDER, PERFORMED IN ~~LOC~~ HOSPITAL LAB): SARS Coronavirus 2: NEGATIVE

## 2021-03-11 SURGERY — OPEN REDUCTION INTERNAL FIXATION (ORIF) HUMERAL SHAFT FRACTURE
Anesthesia: Regional | Site: Shoulder | Laterality: Right

## 2021-03-11 MED ORDER — SUGAMMADEX SODIUM 200 MG/2ML IV SOLN
INTRAVENOUS | Status: DC | PRN
  Administered 2021-03-11: 250 mg via INTRAVENOUS

## 2021-03-11 MED ORDER — OXYCODONE HCL 5 MG PO TABS
5.0000 mg | ORAL_TABLET | ORAL | Status: DC | PRN
  Administered 2021-03-11 – 2021-03-12 (×4): 10 mg via ORAL
  Filled 2021-03-11 (×4): qty 2

## 2021-03-11 MED ORDER — HYDROMORPHONE HCL 1 MG/ML IJ SOLN
0.5000 mg | INTRAMUSCULAR | Status: DC | PRN
  Administered 2021-03-11: 1 mg via INTRAVENOUS
  Filled 2021-03-11: qty 1

## 2021-03-11 MED ORDER — SODIUM CHLORIDE 0.9 % IV SOLN: INTRAVENOUS | Status: DC

## 2021-03-11 MED ORDER — DEXAMETHASONE SODIUM PHOSPHATE 10 MG/ML IJ SOLN
INTRAMUSCULAR | Status: DC | PRN
  Administered 2021-03-11: 8 mg via INTRAVENOUS

## 2021-03-11 MED ORDER — OXYCODONE HCL 5 MG PO TABS
10.0000 mg | ORAL_TABLET | ORAL | Status: DC | PRN
  Filled 2021-03-11: qty 3

## 2021-03-11 MED ORDER — PROMETHAZINE HCL 25 MG/ML IJ SOLN
INTRAMUSCULAR | Status: AC
  Administered 2021-03-11: 12.5 mg via INTRAVENOUS
  Filled 2021-03-11: qty 1

## 2021-03-11 MED ORDER — TADALAFIL 5 MG PO TABS
5.0000 mg | ORAL_TABLET | Freq: Every day | ORAL | Status: DC
  Administered 2021-03-11 – 2021-03-12 (×2): 5 mg via ORAL
  Filled 2021-03-11 (×2): qty 1

## 2021-03-11 MED ORDER — ONDANSETRON HCL 4 MG PO TABS: 4.0000 mg | ORAL_TABLET | Freq: Four times a day (QID) | ORAL | Status: DC | PRN

## 2021-03-11 MED ORDER — ACETAMINOPHEN 325 MG PO TABS: 325.0000 mg | ORAL_TABLET | Freq: Four times a day (QID) | ORAL | Status: DC | PRN

## 2021-03-11 MED ORDER — METOPROLOL SUCCINATE ER 25 MG PO TB24
25.0000 mg | ORAL_TABLET | Freq: Every day | ORAL | Status: DC
  Administered 2021-03-12: 25 mg via ORAL
  Filled 2021-03-11: qty 1

## 2021-03-11 MED ORDER — ORAL CARE MOUTH RINSE: 15.0000 mL | Freq: Once | OROMUCOSAL | Status: AC

## 2021-03-11 MED ORDER — MENTHOL 3 MG MT LOZG: 1.0000 | LOZENGE | OROMUCOSAL | Status: DC | PRN

## 2021-03-11 MED ORDER — FLEET ENEMA 7-19 GM/118ML RE ENEM: 1.0000 | ENEMA | Freq: Once | RECTAL | Status: DC | PRN

## 2021-03-11 MED ORDER — BISACODYL 5 MG PO TBEC: 5.0000 mg | DELAYED_RELEASE_TABLET | Freq: Every day | ORAL | Status: DC | PRN

## 2021-03-11 MED ORDER — PHENOL 1.4 % MT LIQD: 1.0000 | OROMUCOSAL | Status: DC | PRN

## 2021-03-11 MED ORDER — FENTANYL CITRATE (PF) 100 MCG/2ML IJ SOLN
INTRAMUSCULAR | Status: DC | PRN
  Administered 2021-03-11 (×2): 50 ug via INTRAVENOUS

## 2021-03-11 MED ORDER — ESMOLOL HCL 100 MG/10ML IV SOLN
INTRAVENOUS | Status: AC
  Filled 2021-03-11: qty 10

## 2021-03-11 MED ORDER — LACTATED RINGERS IV SOLN: INTRAVENOUS | Status: DC

## 2021-03-11 MED ORDER — CEFAZOLIN SODIUM-DEXTROSE 2-4 GM/100ML-% IV SOLN
2.0000 g | INTRAVENOUS | Status: AC
  Administered 2021-03-11: 2 g via INTRAVENOUS

## 2021-03-11 MED ORDER — LISINOPRIL 20 MG PO TABS
20.0000 mg | ORAL_TABLET | Freq: Every day | ORAL | Status: DC
  Administered 2021-03-11 – 2021-03-12 (×2): 20 mg via ORAL
  Filled 2021-03-11 (×2): qty 1

## 2021-03-11 MED ORDER — CEFAZOLIN SODIUM-DEXTROSE 2-4 GM/100ML-% IV SOLN
2.0000 g | Freq: Four times a day (QID) | INTRAVENOUS | Status: AC
  Administered 2021-03-11 – 2021-03-12 (×3): 2 g via INTRAVENOUS
  Filled 2021-03-11 (×3): qty 100

## 2021-03-11 MED ORDER — ASPIRIN EC 325 MG PO TBEC
325.0000 mg | DELAYED_RELEASE_TABLET | Freq: Two times a day (BID) | ORAL | Status: DC
  Administered 2021-03-12: 325 mg via ORAL
  Filled 2021-03-11: qty 1

## 2021-03-11 MED ORDER — ALUM & MAG HYDROXIDE-SIMETH 200-200-20 MG/5ML PO SUSP: 30.0000 mL | ORAL | Status: DC | PRN

## 2021-03-11 MED ORDER — FENTANYL CITRATE (PF) 100 MCG/2ML IJ SOLN
INTRAMUSCULAR | Status: AC
  Filled 2021-03-11: qty 2

## 2021-03-11 MED ORDER — HYDROCHLOROTHIAZIDE 12.5 MG PO CAPS
12.5000 mg | ORAL_CAPSULE | Freq: Every day | ORAL | Status: DC
  Administered 2021-03-11 – 2021-03-12 (×2): 12.5 mg via ORAL
  Filled 2021-03-11 (×2): qty 1

## 2021-03-11 MED ORDER — FENTANYL CITRATE (PF) 100 MCG/2ML IJ SOLN
INTRAMUSCULAR | Status: AC
  Administered 2021-03-11: 50 ug via INTRAVENOUS
  Filled 2021-03-11: qty 2

## 2021-03-11 MED ORDER — CHLORHEXIDINE GLUCONATE 0.12 % MT SOLN
15.0000 mL | Freq: Once | OROMUCOSAL | Status: AC
  Administered 2021-03-11: 15 mL via OROMUCOSAL

## 2021-03-11 MED ORDER — BUPIVACAINE LIPOSOME 1.3 % IJ SUSP
INTRAMUSCULAR | Status: DC | PRN
  Administered 2021-03-11: 10 mL via PERINEURAL

## 2021-03-11 MED ORDER — SODIUM CHLORIDE 0.9 % IR SOLN
Status: DC | PRN
  Administered 2021-03-11: 1000 mL

## 2021-03-11 MED ORDER — WATER FOR IRRIGATION, STERILE IR SOLN
Status: DC | PRN
  Administered 2021-03-11: 2000 mL

## 2021-03-11 MED ORDER — FERROUS SULFATE 325 (65 FE) MG PO TABS
325.0000 mg | ORAL_TABLET | Freq: Every day | ORAL | Status: DC
  Administered 2021-03-12: 325 mg via ORAL
  Filled 2021-03-11: qty 1

## 2021-03-11 MED ORDER — ACETAMINOPHEN 10 MG/ML IV SOLN: 1000.0000 mg | Freq: Once | INTRAVENOUS | Status: DC | PRN

## 2021-03-11 MED ORDER — ROCURONIUM BROMIDE 10 MG/ML (PF) SYRINGE
PREFILLED_SYRINGE | INTRAVENOUS | Status: AC
  Filled 2021-03-11: qty 10

## 2021-03-11 MED ORDER — ESMOLOL HCL 100 MG/10ML IV SOLN
INTRAVENOUS | Status: DC | PRN
  Administered 2021-03-11 (×3): 20 mg via INTRAVENOUS

## 2021-03-11 MED ORDER — ACETAMINOPHEN 500 MG PO TABS
1000.0000 mg | ORAL_TABLET | Freq: Four times a day (QID) | ORAL | Status: DC
  Administered 2021-03-11 – 2021-03-12 (×3): 1000 mg via ORAL
  Filled 2021-03-11 (×3): qty 2

## 2021-03-11 MED ORDER — DOCUSATE SODIUM 100 MG PO CAPS
100.0000 mg | ORAL_CAPSULE | Freq: Two times a day (BID) | ORAL | Status: DC
  Administered 2021-03-11 – 2021-03-12 (×2): 100 mg via ORAL
  Filled 2021-03-11 (×2): qty 1

## 2021-03-11 MED ORDER — DEXAMETHASONE SODIUM PHOSPHATE 10 MG/ML IJ SOLN
INTRAMUSCULAR | Status: AC
  Filled 2021-03-11: qty 1

## 2021-03-11 MED ORDER — PROMETHAZINE HCL 25 MG/ML IJ SOLN: 6.2500 mg | INTRAMUSCULAR | Status: DC | PRN

## 2021-03-11 MED ORDER — CEFAZOLIN SODIUM-DEXTROSE 2-4 GM/100ML-% IV SOLN: 2.0000 g | INTRAVENOUS | Status: DC

## 2021-03-11 MED ORDER — ROCURONIUM BROMIDE 10 MG/ML (PF) SYRINGE
PREFILLED_SYRINGE | INTRAVENOUS | Status: DC | PRN
  Administered 2021-03-11 (×3): 10 mg via INTRAVENOUS
  Administered 2021-03-11: 70 mg via INTRAVENOUS
  Administered 2021-03-11: 10 mg via INTRAVENOUS

## 2021-03-11 MED ORDER — METHOCARBAMOL 500 MG PO TABS
500.0000 mg | ORAL_TABLET | Freq: Four times a day (QID) | ORAL | Status: DC | PRN
  Administered 2021-03-11 – 2021-03-12 (×2): 500 mg via ORAL
  Filled 2021-03-11 (×3): qty 1

## 2021-03-11 MED ORDER — BUPIVACAINE HCL (PF) 0.5 % IJ SOLN
INTRAMUSCULAR | Status: DC | PRN
  Administered 2021-03-11: 15 mL via PERINEURAL

## 2021-03-11 MED ORDER — FENTANYL CITRATE (PF) 100 MCG/2ML IJ SOLN
25.0000 ug | INTRAMUSCULAR | Status: DC | PRN
  Administered 2021-03-11: 50 ug via INTRAVENOUS

## 2021-03-11 MED ORDER — DIPHENHYDRAMINE HCL 12.5 MG/5ML PO ELIX: 12.5000 mg | ORAL_SOLUTION | ORAL | Status: DC | PRN

## 2021-03-11 MED ORDER — ONDANSETRON HCL 4 MG/2ML IJ SOLN: 4.0000 mg | Freq: Four times a day (QID) | INTRAMUSCULAR | Status: DC | PRN

## 2021-03-11 MED ORDER — METOCLOPRAMIDE HCL 5 MG/ML IJ SOLN: 5.0000 mg | Freq: Three times a day (TID) | INTRAMUSCULAR | Status: DC | PRN

## 2021-03-11 MED ORDER — TRANEXAMIC ACID-NACL 1000-0.7 MG/100ML-% IV SOLN
1000.0000 mg | INTRAVENOUS | Status: AC
  Administered 2021-03-11: 1000 mg via INTRAVENOUS
  Filled 2021-03-11 (×2): qty 100

## 2021-03-11 MED ORDER — ONDANSETRON HCL 4 MG/2ML IJ SOLN
INTRAMUSCULAR | Status: AC
  Filled 2021-03-11: qty 2

## 2021-03-11 MED ORDER — DULOXETINE HCL 60 MG PO CPEP
90.0000 mg | ORAL_CAPSULE | Freq: Every day | ORAL | Status: DC
  Administered 2021-03-11 – 2021-03-12 (×2): 90 mg via ORAL
  Filled 2021-03-11 (×2): qty 1

## 2021-03-11 MED ORDER — SUGAMMADEX SODIUM 500 MG/5ML IV SOLN
INTRAVENOUS | Status: AC
  Filled 2021-03-11: qty 5

## 2021-03-11 MED ORDER — POLYETHYLENE GLYCOL 3350 17 G PO PACK: 17.0000 g | PACK | Freq: Every day | ORAL | Status: DC | PRN

## 2021-03-11 MED ORDER — METOCLOPRAMIDE HCL 5 MG PO TABS: 5.0000 mg | ORAL_TABLET | Freq: Three times a day (TID) | ORAL | Status: DC | PRN

## 2021-03-11 MED ORDER — FENTANYL CITRATE (PF) 100 MCG/2ML IJ SOLN
50.0000 ug | INTRAMUSCULAR | Status: DC
  Administered 2021-03-11: 100 ug via INTRAVENOUS
  Filled 2021-03-11: qty 2

## 2021-03-11 MED ORDER — CEFAZOLIN SODIUM-DEXTROSE 2-4 GM/100ML-% IV SOLN
INTRAVENOUS | Status: AC
  Filled 2021-03-11: qty 100

## 2021-03-11 MED ORDER — PROPOFOL 10 MG/ML IV BOLUS
INTRAVENOUS | Status: DC | PRN
  Administered 2021-03-11: 180 mg via INTRAVENOUS

## 2021-03-11 MED ORDER — ZOLPIDEM TARTRATE 5 MG PO TABS: 5.0000 mg | ORAL_TABLET | Freq: Every evening | ORAL | Status: DC | PRN

## 2021-03-11 MED ORDER — LISINOPRIL-HYDROCHLOROTHIAZIDE 20-12.5 MG PO TABS: 1.0000 | ORAL_TABLET | Freq: Every day | ORAL | Status: DC

## 2021-03-11 MED ORDER — PROPOFOL 10 MG/ML IV BOLUS
INTRAVENOUS | Status: AC
  Filled 2021-03-11: qty 20

## 2021-03-11 MED ORDER — ONDANSETRON HCL 4 MG/2ML IJ SOLN
INTRAMUSCULAR | Status: DC | PRN
  Administered 2021-03-11: 4 mg via INTRAVENOUS

## 2021-03-11 MED ORDER — MIDAZOLAM HCL 2 MG/2ML IJ SOLN
1.0000 mg | INTRAMUSCULAR | Status: DC
  Administered 2021-03-11: 2 mg via INTRAVENOUS
  Filled 2021-03-11: qty 2

## 2021-03-11 MED ORDER — ALBUMIN HUMAN 5 % IV SOLN: INTRAVENOUS | Status: DC | PRN

## 2021-03-11 MED ORDER — PHENYLEPHRINE HCL-NACL 10-0.9 MG/250ML-% IV SOLN
INTRAVENOUS | Status: DC | PRN
  Administered 2021-03-11: 15 ug/min via INTRAVENOUS

## 2021-03-11 MED ORDER — METHOCARBAMOL 1000 MG/10ML IJ SOLN
500.0000 mg | Freq: Four times a day (QID) | INTRAVENOUS | Status: DC | PRN
  Filled 2021-03-11: qty 5

## 2021-03-11 SURGICAL SUPPLY — 81 items
AID PSTN UNV HD RSTRNT DISP (MISCELLANEOUS) ×1
BAG SPEC THK2 15X12 ZIP CLS (MISCELLANEOUS) ×1
BAG ZIPLOCK 12X15 (MISCELLANEOUS) ×2 IMPLANT
BIT DRILL 2.5X110 QC LCP DISP (BIT) ×1 IMPLANT
BIT DRILL 2.8 (BIT) ×1
BIT DRILL CANN QC 2.8X165 (BIT) IMPLANT
BNDG ELASTIC 4X5.8 VLCR STR LF (GAUZE/BANDAGES/DRESSINGS) ×2 IMPLANT
COOLER ICEMAN CLASSIC (MISCELLANEOUS) IMPLANT
COVER BACK TABLE 60X90IN (DRAPES) ×2 IMPLANT
COVER SURGICAL LIGHT HANDLE (MISCELLANEOUS) ×2 IMPLANT
COVER WAND RF STERILE (DRAPES) ×1 IMPLANT
DRAPE C-ARM 42X120 X-RAY (DRAPES) ×1 IMPLANT
DRAPE INCISE IOBAN 66X45 STRL (DRAPES) ×2 IMPLANT
DRAPE ORTHO SPLIT 77X108 STRL (DRAPES) ×4
DRAPE POUCH INSTRU U-SHP 10X18 (DRAPES) ×2 IMPLANT
DRAPE SURG 17X11 SM STRL (DRAPES) ×2 IMPLANT
DRAPE SURG ORHT 6 SPLT 77X108 (DRAPES) ×2 IMPLANT
DRAPE TOP 10253 STERILE (DRAPES) ×2 IMPLANT
DRAPE U-SHAPE 47X51 STRL (DRAPES) ×2 IMPLANT
DRILL BIT 2.8MM (BIT) ×2
DRSG AQUACEL AG ADV 3.5X14 (GAUZE/BANDAGES/DRESSINGS) ×2 IMPLANT
DURAPREP 26ML APPLICATOR (WOUND CARE) ×4 IMPLANT
ELECT BLADE TIP CTD 4 INCH (ELECTRODE) ×2 IMPLANT
ELECT REM PT RETURN 15FT ADLT (MISCELLANEOUS) ×2 IMPLANT
GAUZE XEROFORM 1X8 LF (GAUZE/BANDAGES/DRESSINGS) ×1 IMPLANT
GLOVE SRG 8 PF TXTR STRL LF DI (GLOVE) ×1 IMPLANT
GLOVE SURG ENC MOIS LTX SZ7 (GLOVE) ×2 IMPLANT
GLOVE SURG ENC MOIS LTX SZ7.5 (GLOVE) ×2 IMPLANT
GLOVE SURG UNDER POLY LF SZ7 (GLOVE) ×2 IMPLANT
GLOVE SURG UNDER POLY LF SZ8 (GLOVE) ×2
GOWN STRL REUS W/TWL LRG LVL3 (GOWN DISPOSABLE) ×5 IMPLANT
GOWN STRL REUS W/TWL XL LVL3 (GOWN DISPOSABLE) ×2 IMPLANT
HANDPIECE INTERPULSE COAX TIP (DISPOSABLE) ×2
HEMOSTAT SURGICEL 2X14 (HEMOSTASIS) ×1 IMPLANT
HOOD PEEL AWAY FLYTE STAYCOOL (MISCELLANEOUS) ×6 IMPLANT
KIT BASIN OR (CUSTOM PROCEDURE TRAY) ×2 IMPLANT
KIT TURNOVER KIT A (KITS) ×2 IMPLANT
MANIFOLD NEPTUNE II (INSTRUMENTS) ×2 IMPLANT
NDL TROCAR POINT SZ 2 1/2 (NEEDLE) ×1 IMPLANT
NEEDLE TROCAR POINT SZ 2 1/2 (NEEDLE) IMPLANT
NS IRRIG 1000ML POUR BTL (IV SOLUTION) ×2 IMPLANT
PACK SHOULDER (CUSTOM PROCEDURE TRAY) ×2 IMPLANT
PAD COLD SHLDR WRAP-ON (PAD) ×1 IMPLANT
PLATE PERIARTICULAR 10H RT (Plate) ×1 IMPLANT
PROTECTOR NERVE ULNAR (MISCELLANEOUS) ×1 IMPLANT
RESTRAINT HEAD UNIVERSAL NS (MISCELLANEOUS) ×2 IMPLANT
RETRIEVER SUT HEWSON (MISCELLANEOUS) ×1 IMPLANT
SCREW CORTEX 3.5 36MM (Screw) ×1 IMPLANT
SCREW CORTEX 3.5 45MM (Screw) ×1 IMPLANT
SCREW LOCK CORT ST 3.5X36 (Screw) IMPLANT
SCREW LOCK T15 FT 22X3.5XST (Screw) IMPLANT
SCREW LOCK T15 FT 24X3.5X2.9X (Screw) IMPLANT
SCREW LOCK T15 FT 36X3.5X2.9X (Screw) IMPLANT
SCREW LOCK T15 FT 38X3.5XST (Screw) IMPLANT
SCREW LOCKING 3.5X22 (Screw) ×2 IMPLANT
SCREW LOCKING 3.5X24 (Screw) ×6 IMPLANT
SCREW LOCKING 3.5X34 (Screw) ×1 IMPLANT
SCREW LOCKING 3.5X36 (Screw) ×2 IMPLANT
SCREW LOCKING 3.5X38 (Screw) ×2 IMPLANT
SET HNDPC FAN SPRY TIP SCT (DISPOSABLE) ×1 IMPLANT
SLING ARM FOAM STRAP LRG (SOFTGOODS) ×1 IMPLANT
SLING ARM IMMOBILIZER LRG (SOFTGOODS) ×1 IMPLANT
SMARTMIX MINI TOWER (MISCELLANEOUS)
SPONGE LAP 18X18 RF (DISPOSABLE) ×4 IMPLANT
STAPLER VISISTAT 35W (STAPLE) ×2 IMPLANT
STRIP CLOSURE SKIN 1/2X4 (GAUZE/BANDAGES/DRESSINGS) ×1 IMPLANT
SUCTION FRAZIER HANDLE 12FR (TUBING) ×2
SUCTION TUBE FRAZIER 12FR DISP (TUBING) ×1 IMPLANT
SUPPORT WRAP ARM LG (MISCELLANEOUS) ×1 IMPLANT
SUT ETHIBOND 2 V 37 (SUTURE) ×2 IMPLANT
SUT FIBERTAPE CERCLAGE 2 48 (SUTURE) ×3 IMPLANT
SUT MNCRL AB 4-0 PS2 18 (SUTURE) ×2 IMPLANT
SUT VIC AB 2-0 CT1 27 (SUTURE) ×2
SUT VIC AB 2-0 CT1 TAPERPNT 27 (SUTURE) ×1 IMPLANT
SUTURE TAPE TIGERLINK 1.3MM BL (SUTURE) IMPLANT
SUTURETAPE TIGERLINK 1.3MM BL (SUTURE) ×4
TAPE LABRALWHITE 1.5X36 (TAPE) ×2 IMPLANT
TAPE SUT LABRALTAP WHT/BLK (SUTURE) ×2 IMPLANT
TOWEL OR 17X26 10 PK STRL BLUE (TOWEL DISPOSABLE) ×2 IMPLANT
TOWER SMARTMIX MINI (MISCELLANEOUS) ×1 IMPLANT
WATER STERILE IRR 1000ML POUR (IV SOLUTION) ×3 IMPLANT

## 2021-03-11 NOTE — Plan of Care (Signed)
  Problem: Activity: Goal: Risk for activity intolerance will decrease Outcome: Progressing   Problem: Nutrition: Goal: Adequate nutrition will be maintained Outcome: Progressing   Problem: Pain Managment: Goal: General experience of comfort will improve Outcome: Progressing   

## 2021-03-11 NOTE — Transfer of Care (Signed)
Immediate Anesthesia Transfer of Care Note  Patient: Cesar Davis  Procedure(s) Performed: OPEN REDUCTION INTERNAL FIXATION RIGHT PERIPROSTHETIC HUMERUS FRACTURE (Right: Shoulder)  Patient Location: PACU  Anesthesia Type:GA combined with regional for post-op pain  Level of Consciousness: awake, alert  and patient cooperative  Airway & Oxygen Therapy: Patient Spontanous Breathing and Patient connected to face mask oxygen  Post-op Assessment: Report given to RN and Post -op Vital signs reviewed and stable  Post vital signs: Reviewed and stable  Last Vitals:  Vitals Value Taken Time  BP 125/79 03/11/21 1304  Temp 36.4 C 03/11/21 1304  Pulse 107 03/11/21 1309  Resp 19 03/11/21 1309  SpO2 100 % 03/11/21 1309  Vitals shown include unvalidated device data.  Last Pain:  Vitals:   03/11/21 0910  PainSc: 0-No pain      Patients Stated Pain Goal: 4 (03/11/21 0855)  Complications: No notable events documented.

## 2021-03-11 NOTE — Progress Notes (Signed)
Assisted Dr. Greg Stoltzfus with right, ultrasound guided, interscalene  block. Side rails up, monitors on throughout procedure. See vital signs in flow sheet. Tolerated Procedure well. °

## 2021-03-11 NOTE — H&P (Signed)
Cesar Davis is an 65 y.o. male.   Chief Complaint: Right arm injury HPI: 65 year old gentleman with a history of right total shoulder replacement who had a fall about 1 week ago with subsequent periprosthetic right humerus fracture.  He had a short trial of conservative management but wished to proceed with surgical management to stabilize the fracture and minimize risk of nonunion or malunion.  Past Medical History:  Diagnosis Date   Arthritis    "right shoulder; knees; left ankle" (01/09/2013)   History of shingles 01/2011   Hypertension    Pre-diabetes     Past Surgical History:  Procedure Laterality Date   ANKLE FRACTURE SURGERY Left 2004?   ARTHROSCOPIC REPAIR PCL Right    FINGER SURGERY Bilateral 1985   "fusion pinky;    INGUINAL HERNIA REPAIR Right 2006   JOINT REPLACEMENT Right    shoulder   KNEE ARTHROSCOPY Right 2010; 12/2012   TOTAL KNEE ARTHROPLASTY Right 01/09/2013   TOTAL KNEE ARTHROPLASTY Right 01/09/2013   Procedure: TOTAL KNEE ARTHROPLASTY;  Surgeon: Nestor Lewandowsky, MD;  Location: [facility] OR;  Service: Orthopedics;  Laterality: Right;   TOTAL SHOULDER ARTHROPLASTY Right 09/19/2013   DR Gaby Harney    TOTAL SHOULDER ARTHROPLASTY Right 09/19/2013   Procedure: TOTAL SHOULDER ARTHROPLASTY;  Surgeon: Mable Paris, MD;  Location: [facility] OR;  Service: Orthopedics;  Laterality: Right;  REVISION HEMIARTHROPLASTY TO TOTAL SHOULDER   TOTAL SHOULDER REPLACEMENT Right ~ 2006    History reviewed. No pertinent family history. Social History:  reports that he quit smoking about 21 years ago. His smoking use included cigarettes. He has a 7.50 pack-year smoking history. His smokeless tobacco use includes snuff. He reports previous alcohol use. He reports that he does not use drugs.  Allergies: No Known Allergies  Medications Prior to Admission  Medication Sig Dispense Refill   Cholecalciferol (VITAMIN D3) 10 MCG (400 UNIT) CAPS Take 400 Units by mouth daily.     DULoxetine (CYMBALTA)  30 MG capsule Take 90 mg by mouth daily.     Ferrous Sulfate 28 MG TABS Take 28 mg by mouth daily.     lisinopril-hydrochlorothiazide (ZESTORETIC) 20-12.5 MG tablet Take 1 tablet by mouth daily.     Magnesium Citrate 200 MG TABS Take 200 mg by mouth daily.     metoprolol succinate (TOPROL-XL) 50 MG 24 hr tablet Take 25 mg by mouth daily. Take with or immediately following a meal.     Multiple Vitamin (MULTIVITAMIN) capsule Take 3 capsules by mouth daily.     Omega-3 Fatty Acids (OMEGA 3 500) 500 MG CAPS Take 500 mg by mouth daily.     oxyCODONE-acetaminophen (ROXICET) 5-325 MG per tablet Take 1-2 tablets by mouth every 4 (four) hours as needed for severe pain. 60 tablet 0   tadalafil (CIALIS) 5 MG tablet Take 5 mg by mouth daily.     traMADol (ULTRAM) 50 MG tablet Take 50 mg by mouth every 6 (six) hours as needed.     vitamin B-12 (CYANOCOBALAMIN) 1000 MCG tablet Take 1,000 mcg by mouth daily.     docusate sodium (COLACE) 100 MG capsule Take 1 capsule (100 mg total) by mouth 3 (three) times daily as needed. 20 capsule 0    Results for orders placed or performed during the hospital encounter of 03/11/21 (from the past 48 hour(s))  SARS Coronavirus 2 by RT PCR (hospital order, performed in [facility] hospital lab) Nasopharyngeal Nasopharyngeal Swab     Status: None  Collection Time: 03/11/21  7:35 AM   Specimen: Nasopharyngeal Swab  Result Value Ref Range   SARS Coronavirus 2 NEGATIVE NEGATIVE    Comment: (NOTE) SARS-CoV-2 target nucleic acids are NOT DETECTED.  The SARS-CoV-2 RNA is generally detectable in upper and lower respiratory specimens during the acute phase of infection. The lowest concentration of SARS-CoV-2 viral copies this assay can detect is 250 copies / mL. A negative result does not preclude SARS-CoV-2 infection and should not be used as the sole basis for treatment or other patient management decisions.  A negative result may occur with improper specimen collection /  handling, submission of specimen other than nasopharyngeal swab, presence of viral mutation(s) within the areas targeted by this assay, and inadequate number of viral copies (<250 copies / mL). A negative result must be combined with clinical observations, patient history, and epidemiological information.  Fact Sheet for Patients:   BoilerBrush.com.cy  Fact Sheet for Healthcare Providers: https://pope.com/  This test is not yet approved or  cleared by the [place] FDA and has been authorized for detection and/or diagnosis of SARS-CoV-2 by FDA under an Emergency Use Authorization (EUA).  This EUA will remain in effect (meaning this test can be used) for the duration of the COVID-19 declaration under Section 564(b)(1) of the Act, 21 U.S.C. section 360bbb-3(b)(1), unless the authorization is terminated or revoked sooner.  Performed at [facility], [address] [place], [place] [address]    No results found.  Review of Systems  All other systems reviewed and are negative.  Blood pressure (!) 140/93, pulse 87, temperature 98.1 F (36.7 C), resp. rate 17, height 6\' 1"  (1.854 m), weight 106.6 kg, SpO2 96 %. Physical Exam HENT:     Head: Atraumatic.  Eyes:     Extraocular Movements: Extraocular movements intact.  Cardiovascular:     Pulses: Normal pulses.  Musculoskeletal:     Comments: Right upper extremity swollen and tender to the touch.  Distally grossly neurovascularly intact.  Neurological:     Mental Status: He is alert.  Psychiatric:        Mood and Affect: Mood normal.     Assessment/Plan Right periprosthetic humerus fracture Plan ORIF Risks / benefits of surgery discussed Consent on chart  NPO for OR Preop antibiotics   , MD 03/11/2021, 8:54 AM

## 2021-03-11 NOTE — Anesthesia Procedure Notes (Signed)
Procedure Name: Intubation Date/Time: 03/11/2021 9:58 AM Performed by: West Pugh, CRNA Pre-anesthesia Checklist: Patient identified, Emergency Drugs available, Suction available, Patient being monitored and Timeout performed Patient Re-evaluated:Patient Re-evaluated prior to induction Oxygen Delivery Method: Circle system utilized Preoxygenation: Pre-oxygenation with 100% oxygen Induction Type: IV induction Ventilation: Mask ventilation without difficulty Laryngoscope Size: Mac and 4 Grade View: Grade II Tube type: Oral Tube size: 7.5 mm Number of attempts: 1 Airway Equipment and Method: Stylet Placement Confirmation: ETT inserted through vocal cords under direct vision, positive ETCO2, CO2 detector and breath sounds checked- equal and bilateral Secured at: 22 cm Tube secured with: Tape Dental Injury: Teeth and Oropharynx as per pre-operative assessment

## 2021-03-11 NOTE — Progress Notes (Signed)
Patient admitted to room. Alert and oriented x4. Skin dry and warm to touch. C/o pain to shoulder. Ice applied. Bruises noted to lt lower arm and rt shoulder. Respiration even and unlabor. Oriented to room room.

## 2021-03-11 NOTE — Anesthesia Postprocedure Evaluation (Signed)
Anesthesia Post Note  Patient: Cesar Davis  Procedure(s) Performed: OPEN REDUCTION INTERNAL FIXATION RIGHT PERIPROSTHETIC HUMERUS FRACTURE (Right: Shoulder)     Patient location during evaluation: PACU Anesthesia Type: Regional and General Level of consciousness: awake and alert Pain management: pain level controlled Vital Signs Assessment: post-procedure vital signs reviewed and stable Respiratory status: spontaneous breathing, nonlabored ventilation, respiratory function stable and patient connected to nasal cannula oxygen Cardiovascular status: blood pressure returned to baseline and stable Postop Assessment: no apparent nausea or vomiting Anesthetic complications: no   No notable events documented.  Last Vitals:  Vitals:   03/11/21 1430 03/11/21 1454  BP: (!) 148/98 138/80  Pulse: (!) 113 (!) 109  Resp: 18 16  Temp: 36.6 C 36.8 C  SpO2: 97% 92%    Last Pain:  Vitals:   03/11/21 1454  TempSrc: Oral  PainSc: 7                  Tu Bayle P Taraya Steward

## 2021-03-11 NOTE — Op Note (Signed)
Procedure(s): OPEN REDUCTION INTERNAL FIXATION RIGHT PERIPROSTHETIC HUMERUS FRACTURE Procedure Note  Cesar Davis male 65 y.o. 03/11/2021  Preoperative diagnosis: Right periprosthetic humerus fracture  Postoperative diagnosis: Same   Procedure(s) and Anesthesia Type:    * OPEN REDUCTION INTERNAL FIXATION RIGHT PERIPROSTHETIC HUMERUS FRACTURE - General   Indications:  65 y.o. male  With right periprosthetic humerus fracture with an apparent well fixed stem proximally.  Indicated for surgical treatment to restore alignment and promote anatomic healing and lessen risk of nonunion.  Patient understood risks of surgery including damage to neurovascular structures, nonunion, malunion and potential need for future surgery.     Surgeon: Berline Lopes   Assistants: Fredia Sorrow PA-C ([address] was present and scrubbed throughout the procedure and was essential in positioning, retraction, exposure, and closure) Manley Mason PA-C was also present throughout the procedure and assisted as well.  Anesthesia: General endotracheal anesthesia    Procedure Detail  OPEN REDUCTION INTERNAL FIXATION RIGHT PERIPROSTHETIC HUMERUS FRACTURE   Estimated Blood Loss:  300 mL         Drains: none  Blood Given: none          Specimens: none        Complications:  * No complications entered in OR log *         Disposition: PACU - hemodynamically stable.         Condition: stable      OPERATIVE FINDINGS:  Near anatomic reduction of the comminuted fracture with a 10 hole [employer] periarticular locking plate with 4 proximal locking screws in the metaphysis, 4 screws distal to the fracture site including 3 locking screws and 2 fiber tape cerclage just proximal to the main fracture.  PROCEDURE: The patient was identified in the preoperative holding area  where I personally marked the operative site after verifying site, side,  and procedure with the patient. An interscalene block given by   the attending anesthesiologist in the holding area and the patient was taken back to the operating room where all extremities were  carefully padded in position after general anesthesia was induced. He  was placed in a beach-chair position and the operative upper extremity was  prepped and draped in a standard sterile fashion.  The previous scar from his total shoulder replacement was marked out and then extended distally towards the elbow.  Total incision length was approximately 24 cm. Dissection was carried down through subcutaneous tissues.  Initially the deltopectoral interval was identified proximally.  The deltoid was taken laterally with the pectoralis major taken medially.  Distally the lateral border of the biceps was identified and the interval between the biceps and the brachialis was dissected.  The brachialis was noted to be significantly damaged due to the fracture in this area but an effort was made to come down through the central aspect of the brachialis to the underlying humerus.  The fracture was exposed proximal to distal and was noted to be quite comminuted with a free fragment proximal lateral.  The implant was palpated and felt to be intact proximally without loosening.  Fracture reduction clamps were used to reduce the main central portion of the fracture and a 10 hole locking periarticular plate was placed laterally.  A nonlocking screw was placed distally to bring the plate down to the bone and then proximal to the main fracture site a fiber tape cerclage was passed taking great care to stay directly on the bone.  The plate was held against the bone  proximally in the appropriate alignment and the cerclage system was then tightened to bring the plate down securely to the bone.  Attention was then turned to the proximal locking screws on the periarticular plate where 4 locking screws were placed between 22 and 24 mm each.  At this point 1 additional fiber tape cerclage was placed more  proximal to the initial securing the more lateral free cortical fragment and additionally stabilizing the plate to the bone, again taking great care to stay directly on bone.  At this point attention was turned distally where 3 locking screws were placed bicortically for total of 8 cortices distal to the fracture. Final fluoroscopic imaging demonstrated near anatomic alignment of the fracture with appropriate positioning of the plate and screws.  The fixation was felt to be excellent. Wound was copiously irrigated with saline and subsequently closed in layers with 0 Vicryl 2-0 Vicryl and staples for skin closure. Sterile dressing was applied as well as an Ace bandage from the hand to the shoulder.  Patient was then allowed to awaken from anesthesia transferred to the stretcher and taken to the recovery room in stable condition.  Postoperative plan: Patient will be observed overnight for pain control and observation.  He will be discharged home in the morning with family.

## 2021-03-11 NOTE — Anesthesia Procedure Notes (Signed)
Anesthesia Regional Block: Interscalene brachial plexus block   Pre-Anesthetic Checklist: , timeout performed,  Correct Patient, Correct Site, Correct Laterality,  Correct Procedure, Correct Position, site marked,  Risks and benefits discussed,  Surgical consent,  Pre-op evaluation,  At surgeon's request and post-op pain management  Laterality: Right  Prep: Dura Prep       Needles:  Injection technique: Single-shot  Needle Type: Echogenic Stimulator Needle     Needle Length: 5cm  Needle Gauge: 20     Additional Needles:   Procedures:,,,, ultrasound used (permanent image in chart),,    Narrative:  Start time: 03/11/2021 9:00 AM End time: 03/11/2021 9:04 AM Injection made incrementally with aspirations every 5 mL.  Performed by: Personally  Anesthesiologist: Atilano Median, DO  Additional Notes: Patient identified. Risks/Benefits/Options discussed with patient including but not limited to bleeding, infection, nerve damage, failed block, incomplete pain control. Patient expressed understanding and wished to proceed. All questions were answered. Sterile technique was used throughout the entire procedure. Please see nursing notes for vital signs. Aspirated in 5cc intervals with injection for negative confirmation. Patient was given instructions on fall risk and not to get out of bed. All questions and concerns addressed with instructions to call with any issues or inadequate analgesia.

## 2021-03-11 NOTE — Anesthesia Preprocedure Evaluation (Signed)
Anesthesia Evaluation  Patient identified by MRN, date of birth, ID band Patient awake    Reviewed: Allergy & Precautions, NPO status , Patient's Chart, lab work & pertinent test results  Airway Mallampati: II  TM Distance: >3 FB Neck ROM: Full    Dental  (+) Teeth Intact   Pulmonary neg pulmonary ROS, former smoker,    Pulmonary exam normal        Cardiovascular hypertension, Pt. on medications and Pt. on home beta blockers  Rhythm:Regular Rate:Normal     Neuro/Psych negative neurological ROS  negative psych ROS   GI/Hepatic negative GI ROS, Neg liver ROS,   Endo/Other  negative endocrine ROS  Renal/GU negative Renal ROS  negative genitourinary   Musculoskeletal  (+) Arthritis , Osteoarthritis,  Right humerus fracture   Abdominal (+)  Abdomen: soft. Bowel sounds: normal.  Peds  Hematology negative hematology ROS (+)   Anesthesia Other Findings   Reproductive/Obstetrics                             Anesthesia Physical Anesthesia Plan  ASA: 2  Anesthesia Plan: General and Regional   Post-op Pain Management:  Regional for Post-op pain   Induction: Intravenous  PONV Risk Score and Plan: 2 and Ondansetron, Dexamethasone, Midazolam and Treatment may vary due to age or medical condition  Airway Management Planned: Mask and Oral ETT  Additional Equipment: None  Intra-op Plan:   Post-operative Plan: Extubation in OR  Informed Consent: I have reviewed the patients History and Physical, chart, labs and discussed the procedure including the risks, benefits and alternatives for the proposed anesthesia with the patient or authorized representative who has indicated his/her understanding and acceptance.     Dental advisory given  Plan Discussed with: CRNA  Anesthesia Plan Comments: (Lab Results      Component                Value               Date                      WBC                       8.9                 03/02/2021                HGB                      16.7                03/02/2021                HCT                      50.8                03/02/2021                MCV                      85.2                03/02/2021                PLT  358                 03/02/2021           Lab Results      Component                Value               Date                      NA                       136                 03/02/2021                K                        4.2                 03/02/2021                CO2                      23                  03/02/2021                GLUCOSE                  107 (H)             03/02/2021                BUN                      19                  03/02/2021                CREATININE               0.95                03/02/2021                CALCIUM                  9.6                 03/02/2021                GFRNONAA                 >60                 03/02/2021                GFRAA                    85 (L)              09/20/2013          )        Anesthesia Quick Evaluation

## 2021-03-11 NOTE — Discharge Instructions (Signed)
Discharge Instructions after Open Shoulder Repair  A sling has been provided for you. Remain in your sling at all times. This includes sleeping in your sling.  Use ice on the shoulder intermittently over the first 48 hours after surgery.  Pain medicine has been prescribed for you.  Use your medicine liberally over the first 48 hours, and then you can begin to taper your use. You may take Extra Strength Tylenol or Tylenol only in place of the pain pills. DO NOT take ANY nonsteroidal anti-inflammatory pain medications: Advil, Motrin, Ibuprofen, Aleve, Naproxen or Naprosyn.  Dressing is waterproof. You may shower with the dressing on. Do not remove dressing. Take one aspirin, a day for 2 weeks after surgery, unless you have an aspirin sensitivity/ allergy or asthma.   Please call [phone] during normal business hours or [phone] after hours for any problems. Including the following:  - excessive redness of the incisions - drainage for more than 4 days - fever of more than 101.5 F  *Please note that pain medications will not be refilled after hours or on weekends.

## 2021-03-12 ENCOUNTER — Other Ambulatory Visit (HOSPITAL_COMMUNITY): Payer: Self-pay

## 2021-03-12 DIAGNOSIS — I10 Essential (primary) hypertension: Secondary | ICD-10-CM | POA: Diagnosis not present

## 2021-03-12 DIAGNOSIS — Z79899 Other long term (current) drug therapy: Secondary | ICD-10-CM | POA: Diagnosis not present

## 2021-03-12 DIAGNOSIS — Z96651 Presence of right artificial knee joint: Secondary | ICD-10-CM | POA: Diagnosis not present

## 2021-03-12 DIAGNOSIS — W19XXXA Unspecified fall, initial encounter: Secondary | ICD-10-CM | POA: Diagnosis not present

## 2021-03-12 DIAGNOSIS — Z96611 Presence of right artificial shoulder joint: Secondary | ICD-10-CM | POA: Diagnosis not present

## 2021-03-12 DIAGNOSIS — Z87891 Personal history of nicotine dependence: Secondary | ICD-10-CM | POA: Diagnosis not present

## 2021-03-12 DIAGNOSIS — R7303 Prediabetes: Secondary | ICD-10-CM | POA: Diagnosis not present

## 2021-03-12 DIAGNOSIS — Z20822 Contact with and (suspected) exposure to covid-19: Secondary | ICD-10-CM | POA: Diagnosis not present

## 2021-03-12 DIAGNOSIS — M9731XA Periprosthetic fracture around internal prosthetic right shoulder joint, initial encounter: Secondary | ICD-10-CM | POA: Diagnosis not present

## 2021-03-12 LAB — CBC
HCT: 33.2 % — ABNORMAL LOW (ref 39.0–52.0)
Hemoglobin: 10.9 g/dL — ABNORMAL LOW (ref 13.0–17.0)
MCH: 28.5 pg (ref 26.0–34.0)
MCHC: 32.8 g/dL (ref 30.0–36.0)
MCV: 86.7 fL (ref 80.0–100.0)
Platelets: 323 10*3/uL (ref 150–400)
RBC: 3.83 MIL/uL — ABNORMAL LOW (ref 4.22–5.81)
RDW: 12.8 % (ref 11.5–15.5)
WBC: 13.3 10*3/uL — ABNORMAL HIGH (ref 4.0–10.5)
nRBC: 0 % (ref 0.0–0.2)

## 2021-03-12 MED ORDER — ASPIRIN 325 MG PO TBEC
325.0000 mg | DELAYED_RELEASE_TABLET | Freq: Every day | ORAL | 0 refills | Status: DC
  Filled 2021-03-12: qty 30, 30d supply, fill #0

## 2021-03-12 MED ORDER — OXYCODONE-ACETAMINOPHEN 5-325 MG PO TABS
1.0000 | ORAL_TABLET | Freq: Four times a day (QID) | ORAL | 0 refills | Status: DC | PRN
  Filled 2021-03-12: qty 30, 3d supply, fill #0

## 2021-03-12 MED ORDER — TIZANIDINE HCL 2 MG PO TABS
4.0000 mg | ORAL_TABLET | Freq: Three times a day (TID) | ORAL | 0 refills | Status: DC | PRN
  Filled 2021-03-12: qty 30, 5d supply, fill #0

## 2021-03-12 NOTE — Plan of Care (Signed)
  Problem: Education: Goal: Knowledge of General Education information will improve Description Including pain rating scale, medication(s)/side effects and non-pharmacologic comfort measures Outcome: Progressing   Problem: Health Behavior/Discharge Planning: Goal: Ability to manage health-related needs will improve Outcome: Progressing   

## 2021-03-12 NOTE — Discharge Summary (Signed)
Physician Discharge Summary  Patient ID: LAFAYETTE DUNLEVY MRN: [id number] DOB/AGE: [birth year] 65 y.o.  Admit date: 03/11/2021 Discharge date: 03/12/2021  Admission Diagnoses: Right humerus, periprosthetic fracture  Discharge Diagnoses:  Active Problems:   S/P ORIF (open reduction internal fixation) fracture   Discharged Condition: stable  Hospital Course: Patient presented to OR for repair of right humerus periprosthetic fracture. Surgery was completed without complication. Patient transferred to PACU and subsequently to the orthopedic floor.  Consults: None  Significant Diagnostic Studies: radiology: X-Ray: right shoulder and humerus, successful ORIF of right humerus periprosthetic fracture  Treatments: surgery: ORIF right humerus, shoulder periprosthetic fracture  Discharge Exam: Blood pressure 129/85, pulse 95, temperature 98.4 F (36.9 C), temperature source Oral, resp. rate 18, height 6\' 1"  (1.854 m), weight 106.6 kg, SpO2 94 %. General appearance: alert, cooperative, and no distress Resp: no labored breathing Extremities: bruising and swelling in right UE, as noted preop Pulses: 2+ and symmetric Skin: Skin color, texture, turgor normal. No rashes or lesions Neurologic: Alert and oriented X 3, normal strength and tone Incision/Wound: Dressing C/D/I  Disposition: Discharge disposition: 01-Home or Self Care       Discharge Instructions     Call MD / Call 911   Complete by: As directed    If you experience chest pain or shortness of breath, CALL 911 and be transported to the hospital emergency room.  If you develope a fever above 101 F, pus (white drainage) or increased drainage or redness at the wound, or calf pain, call your surgeon's office.   Constipation Prevention   Complete by: As directed    Drink plenty of fluids.  Prune juice may be helpful.  You may use a stool softener, such as Colace (over the counter) 100 mg twice a day.  Use MiraLax (over the counter) for  constipation as needed.   Diet - low sodium heart healthy   Complete by: As directed    Discharge instructions   Complete by: As directed    Discharge Instructions after Open Shoulder Repair  A sling has been provided for you. Remain in your sling at all times. This includes sleeping in your sling.  Use ice on the shoulder intermittently over the first 48 hours after surgery.  Pain medicine has been prescribed for you.  Use your medicine liberally over the first 48 hours, and then you can begin to taper your use. You may take Extra Strength Tylenol or Tylenol only in place of the pain pills. DO NOT take ANY nonsteroidal anti-inflammatory pain medications: Advil, Motrin, Ibuprofen, Aleve, Naproxen or Naprosyn.  You may remove ACE wrap 48 hours after surgery and shower. Dressing is waterproof Take one aspirin, a day for 2 weeks after surgery, unless you have an aspirin sensitivity/ allergy or asthma.   Please call [phone] during normal business hours or [phone] after hours for any problems. Including the following:  - excessive redness of the incisions - drainage for more than 4 days - fever of more than 101.5 F  *Please note that pain medications will not be refilled after hours or on weekends.   Post-operative opioid taper instructions:   Complete by: As directed    POST-OPERATIVE OPIOID TAPER INSTRUCTIONS: It is important to wean off of your opioid medication as soon as possible. If you do not need pain medication after your surgery it is ok to stop day one. Opioids include: Codeine, Hydrocodone(Norco, Vicodin), Oxycodone(Percocet, oxycontin) and hydromorphone amongst others.  Long term and even short term  use of opiods can cause: Increased pain response Dependence Constipation Depression Respiratory depression And more.  Withdrawal symptoms can include Flu like symptoms Nausea, vomiting And more Techniques to manage these symptoms Hydrate well Eat regular healthy  meals Stay active Use relaxation techniques(deep breathing, meditating, yoga) Do Not substitute Alcohol to help with tapering If you have been on opioids for less than two weeks and do not have pain than it is ok to stop all together.  Plan to wean off of opioids This plan should start within one week post op of your joint replacement. Maintain the same interval or time between taking each dose and first decrease the dose.  Cut the total daily intake of opioids by one tablet each day Next start to increase the time between doses. The last dose that should be eliminated is the evening dose.         Allergies as of 03/12/2021   No Known Allergies      Medication List     STOP taking these medications    traMADol 50 MG tablet Commonly known as: ULTRAM       TAKE these medications    aspirin 325 MG EC tablet Take 1 tablet (325 mg total) by mouth daily.   docusate sodium 100 MG capsule Commonly known as: Colace Take 1 capsule (100 mg total) by mouth 3 (three) times daily as needed.   DULoxetine 30 MG capsule Commonly known as: CYMBALTA Take 90 mg by mouth daily.   Ferrous Sulfate 28 MG Tabs Take 28 mg by mouth daily.   lisinopril-hydrochlorothiazide 20-12.5 MG tablet Commonly known as: ZESTORETIC Take 1 tablet by mouth daily.   Magnesium Citrate 200 MG Tabs Take 200 mg by mouth daily.   metoprolol succinate 50 MG 24 hr tablet Commonly known as: TOPROL-XL Take 25 mg by mouth daily. Take with or immediately following a meal.   multivitamin capsule Take 3 capsules by mouth daily.   Omega 3 500 500 MG Caps Take 500 mg by mouth daily.   oxyCODONE-acetaminophen 5-325 MG tablet Commonly known as: Roxicet Take 1-2 tablets by mouth every 6 (six) hours as needed for severe pain. What changed: when to take this   tadalafil 5 MG tablet Commonly known as: CIALIS Take 5 mg by mouth daily.   tiZANidine 2 MG tablet Commonly known as: ZANAFLEX Take 2 tablets (4 mg  total) by mouth every 8 (eight) hours as needed (muscle pain).   vitamin B-12 1000 MCG tablet Commonly known as: CYANOCOBALAMIN Take 1,000 mcg by mouth daily.   Vitamin D3 10 MCG (400 UNIT) Caps Take 400 Units by mouth daily.        Follow-up Information     Jones Broom, MD. Schedule an appointment as soon as possible for a visit in 2 week(s).   Specialty: Orthopedic Surgery Contact information: [address] [place] [address] [phone]                 Signed: Joice Lofts L. [place] 03/12/2021, 8:05 AM

## 2021-03-12 NOTE — Progress Notes (Signed)
PATIENT ID: Cesar Davis  MRN: [id number]  DOB/AGE:  04/07/1956 / 65 y.o.  1 Day Post-Op Procedure(s) (LRB): OPEN REDUCTION INTERNAL FIXATION RIGHT PERIPROSTHETIC HUMERUS FRACTURE (Right)  Subjective: Patient resting in room. Pain is mild. Reports block wore off overnight.  No c/o chest pain or SOB. Voiding well and positive flatus.   Objective: Vital signs in last 24 hours: Temp:  [97.6 F (36.4 C)-98.8 F (37.1 C)] 98.4 F (36.9 C) (06/10 0422) Pulse Rate:  [75-114] 95 (06/10 0422) Resp:  [16-27] 18 (06/10 0422) BP: (125-178)/(78-120) 129/85 (06/10 0422) SpO2:  [92 %-100 %] 94 % (06/10 0422) Weight:  [106.6 kg] 106.6 kg (06/09 0827)  Intake/Output from previous day: 06/09 0701 - 06/10 0700 In: 3655 [I.V.:3005; IV Piggyback:650] Out: 3000 [Urine:2300; Blood:700]   Recent Labs    03/12/21 0330  HGB 10.9*   Recent Labs    03/12/21 0330  WBC 13.3*  RBC 3.83*  HCT 33.2*  PLT 323     Physical Exam: Alert and oriented. No acute distress Able to flex and extend fingers on right hand, wrist motion intact Sensation to light touch intact Distal pulses 2+ Dressing C/D/I  Assessment/Plan: 1 Day Post-Op Procedure(s) (LRB): OPEN REDUCTION INTERNAL FIXATION RIGHT PERIPROSTHETIC HUMERUS FRACTURE (Right)   Advance diet Non Weight Bearing (NWB) right UE  VTE prophylaxis:  aspirin  Plan for DC home today. Remain in sling except for showering, where he can perform controlled flexion and extension of the elbow. Follow up in 2 weeks.    Kani Chauvin L. [place] 03/12/2021, 7:58 AM

## 2021-03-12 NOTE — Progress Notes (Signed)
The patient is alert and oriented and has been seen by his physician. The orders for discharge were written. IV has been removed. Went over discharge instructions with patient and family. He is about to be discharged via wheelchair with all of his belongings.   

## 2021-03-22 ENCOUNTER — Encounter (HOSPITAL_COMMUNITY): Payer: Self-pay | Admitting: Orthopedic Surgery

## 2021-03-24 DIAGNOSIS — Z9889 Other specified postprocedural states: Secondary | ICD-10-CM | POA: Diagnosis not present

## 2021-04-16 DIAGNOSIS — R634 Abnormal weight loss: Secondary | ICD-10-CM | POA: Diagnosis not present

## 2021-04-16 DIAGNOSIS — R351 Nocturia: Secondary | ICD-10-CM | POA: Diagnosis not present

## 2021-04-16 DIAGNOSIS — M25561 Pain in right knee: Secondary | ICD-10-CM | POA: Diagnosis not present

## 2021-04-16 DIAGNOSIS — M25511 Pain in right shoulder: Secondary | ICD-10-CM | POA: Diagnosis not present

## 2021-04-21 ENCOUNTER — Other Ambulatory Visit: Payer: Self-pay

## 2021-04-21 DIAGNOSIS — Z9889 Other specified postprocedural states: Secondary | ICD-10-CM | POA: Diagnosis not present

## 2021-04-28 DIAGNOSIS — R7301 Impaired fasting glucose: Secondary | ICD-10-CM | POA: Diagnosis not present

## 2021-04-28 DIAGNOSIS — R972 Elevated prostate specific antigen [PSA]: Secondary | ICD-10-CM | POA: Diagnosis not present

## 2021-04-28 DIAGNOSIS — E119 Type 2 diabetes mellitus without complications: Secondary | ICD-10-CM | POA: Diagnosis not present

## 2021-05-04 ENCOUNTER — Other Ambulatory Visit: Payer: Self-pay | Admitting: Orthopaedic Surgery

## 2021-05-04 DIAGNOSIS — Z01818 Encounter for other preprocedural examination: Secondary | ICD-10-CM

## 2021-05-11 DIAGNOSIS — M25511 Pain in right shoulder: Secondary | ICD-10-CM | POA: Diagnosis not present

## 2021-05-11 DIAGNOSIS — M6281 Muscle weakness (generalized): Secondary | ICD-10-CM | POA: Diagnosis not present

## 2021-05-11 DIAGNOSIS — M25611 Stiffness of right shoulder, not elsewhere classified: Secondary | ICD-10-CM | POA: Diagnosis not present

## 2021-05-18 DIAGNOSIS — M6281 Muscle weakness (generalized): Secondary | ICD-10-CM | POA: Diagnosis not present

## 2021-05-18 DIAGNOSIS — M25611 Stiffness of right shoulder, not elsewhere classified: Secondary | ICD-10-CM | POA: Diagnosis not present

## 2021-05-18 DIAGNOSIS — M25511 Pain in right shoulder: Secondary | ICD-10-CM | POA: Diagnosis not present

## 2021-05-24 DIAGNOSIS — M6281 Muscle weakness (generalized): Secondary | ICD-10-CM | POA: Diagnosis not present

## 2021-05-24 DIAGNOSIS — M25511 Pain in right shoulder: Secondary | ICD-10-CM | POA: Diagnosis not present

## 2021-05-24 DIAGNOSIS — M25611 Stiffness of right shoulder, not elsewhere classified: Secondary | ICD-10-CM | POA: Diagnosis not present

## 2021-05-25 NOTE — Patient Instructions (Addendum)
DUE TO COVID-19 ONLY ONE VISITOR IS ALLOWED TO COME WITH YOU AND STAY IN THE WAITING ROOM ONLY DURING PRE OP AND PROCEDURE DAY OF SURGERY. THE 2 VISITORS  MAY VISIT WITH YOU AFTER SURGERY IN YOUR PRIVATE ROOM DURING VISITING HOURS ONLY!  YOU NEED TO HAVE A COVID 19 TEST ON_9/2______THIS TEST MUST BE DONE BEFORE SURGERY,                 Cesar Davis     Your procedure is scheduled on: 06/08/21   Report to [facility] Main  Entrance   Report to admitting at   7:45 AM     Call this number if you have problems the morning of surgery [phone]    BRUSH YOUR TEETH MORNING OF SURGERY AND RINSE YOUR MOUTH OUT, NO CHEWING GUM CANDY OR MINTS.   No food after midnight.    You may have clear liquid until 7:00 AM.    At 6:30 AM drink pre surgery drink.   Nothing by mouth after 7:00 AM.    Take these medicines the morning of surgery with A SIP OF WATER: Duloxetine, Metoprolol  DO NOT TAKE ANY DIABETIC MEDICATIONS DAY OF YOUR SURGERY                               You may not have any metal on your body including              piercings  Do not wear jewelry, lotion,powders or deodorant                         Men may shave face and neck.   Do not bring valuables to the hospital. [facility] IS NOT             RESPONSIBLE   FOR VALUABLES.  Contacts, dentures or bridgework may not be worn into surgery.        Special Instructions: N/A              Please read over the following fact sheets you were given: _____________________________________________________________________             [facility] - Preparing for Surgery Before surgery, you can play an important role.  Because skin is not sterile, your skin needs to be as free of germs as possible.  You can reduce the number of germs on your skin by washing with CHG (chlorahexidine gluconate) soap before surgery.  CHG is an antiseptic cleaner which kills germs and bonds with the skin to continue killing germs even after  washing. Please DO NOT use if you have an allergy to CHG or antibacterial soaps.  If your skin becomes reddened/irritated stop using the CHG and inform your nurse when you arrive at Short Stay.   You may shave your face/neck. Please follow these instructions carefully:  1.  Shower with CHG Soap the night before surgery and the  morning of Surgery.  2.  If you choose to wash your hair, wash your hair first as usual with your  normal  shampoo.  3.  After you shampoo, rinse your hair and body thoroughly to remove the  shampoo.                            4.  Use CHG as you would any other liquid  soap.  You can apply chg directly  to the skin and wash                       Gently with a scrungie or clean washcloth.  5.  Apply the CHG Soap to your body ONLY FROM THE NECK DOWN.   Do not use on face/ open                           Wound or open sores. Avoid contact with eyes, ears mouth and genitals (private parts).                       Wash face,  Genitals (private parts) with your normal soap.             6.  Wash thoroughly, paying special attention to the area where your surgery  will be performed.  7.  Thoroughly rinse your body with warm water from the neck down.  8.  DO NOT shower/wash with your normal soap after using and rinsing off  the CHG Soap.             9.  Pat yourself dry with a clean towel.            10.  Wear clean pajamas.            11.  Place clean sheets on your bed the night of your first shower and do not  sleep with pets. Day of Surgery : Do not apply any lotions/deodorants the morning of surgery.  Please wear clean clothes to the hospital/surgery center.  FAILURE TO FOLLOW THESE INSTRUCTIONS MAY RESULT IN THE CANCELLATION OF YOUR SURGERY PATIENT SIGNATURE_________________________________  NURSE SIGNATURE__________________________________  ________________________________________________________________________   Cesar Davis  An incentive spirometer is a  tool that can help keep your lungs clear and active. This tool measures how well you are filling your lungs with each breath. Taking long deep breaths may help reverse or decrease the chance of developing breathing (pulmonary) problems (especially infection) following: A long period of time when you are unable to move or be active. BEFORE THE PROCEDURE  If the spirometer includes an indicator to show your best effort, your nurse or respiratory therapist will set it to a desired goal. If possible, sit up straight or lean slightly forward. Try not to slouch. Hold the incentive spirometer in an upright position. INSTRUCTIONS FOR USE  Sit on the edge of your bed if possible, or sit up as far as you can in bed or on a chair. Hold the incentive spirometer in an upright position. Breathe out normally. Place the mouthpiece in your mouth and seal your lips tightly around it. Breathe in slowly and as deeply as possible, raising the piston or the ball toward the top of the column. Hold your breath for 3-5 seconds or for as long as possible. Allow the piston or ball to fall to the bottom of the column. Remove the mouthpiece from your mouth and breathe out normally. Rest for a few seconds and repeat Steps 1 through 7 at least 10 times every 1-2 hours when you are awake. Take your time and take a few normal breaths between deep breaths. The spirometer may include an indicator to show your best effort. Use the indicator as a goal to work toward during each repetition. After each set of 10 deep breaths, practice coughing to be  sure your lungs are clear. If you have an incision (the cut made at the time of surgery), support your incision when coughing by placing a pillow or rolled up towels firmly against it. Once you are able to get out of bed, walk around indoors and cough well. You may stop using the incentive spirometer when instructed by your caregiver.  RISKS AND COMPLICATIONS Take your time so you do not  get dizzy or light-headed. If you are in pain, you may need to take or ask for pain medication before doing incentive spirometry. It is harder to take a deep breath if you are having pain. AFTER USE Rest and breathe slowly and easily. It can be helpful to keep track of a log of your progress. Your caregiver can provide you with a simple table to help with this. If you are using the spirometer at home, follow these instructions: SEEK MEDICAL CARE IF:  You are having difficultly using the spirometer. You have trouble using the spirometer as often as instructed. Your pain medication is not giving enough relief while using the spirometer. You develop fever of 100.5 F (38.1 C) or higher. SEEK IMMEDIATE MEDICAL CARE IF:  You cough up bloody sputum that had not been present before. You develop fever of 102 F (38.9 C) or greater. You develop worsening pain at or near the incision site. MAKE SURE YOU:  Understand these instructions. Will watch your condition. Will get help right away if you are not doing well or get worse. Document Released: 01/30/2007 Document Revised: 12/12/2011 Document Reviewed: 04/02/2007 [facility] Patient Information 2014 [place], [place].   ________________________________________________________________________

## 2021-05-26 ENCOUNTER — Other Ambulatory Visit: Payer: Self-pay

## 2021-05-26 ENCOUNTER — Encounter (HOSPITAL_COMMUNITY): Payer: Self-pay

## 2021-05-26 ENCOUNTER — Encounter (HOSPITAL_COMMUNITY)
Admission: RE | Admit: 2021-05-26 | Discharge: 2021-05-26 | Disposition: A | Discharge status: Home / Self Care | Payer: BC Managed Care – PPO | Source: Ambulatory Visit | Attending: Orthopaedic Surgery | Admitting: Orthopaedic Surgery

## 2021-05-26 DIAGNOSIS — Z01812 Encounter for preprocedural laboratory examination: Secondary | ICD-10-CM | POA: Insufficient documentation

## 2021-05-26 DIAGNOSIS — Z01818 Encounter for other preprocedural examination: Secondary | ICD-10-CM

## 2021-05-26 LAB — URINALYSIS, ROUTINE W REFLEX MICROSCOPIC
Bilirubin Urine: NEGATIVE
Glucose, UA: NEGATIVE mg/dL
Hgb urine dipstick: NEGATIVE
Ketones, ur: NEGATIVE mg/dL
Leukocytes,Ua: NEGATIVE
Nitrite: NEGATIVE
Protein, ur: NEGATIVE mg/dL
Specific Gravity, Urine: 1.017 (ref 1.005–1.030)
pH: 8 (ref 5.0–8.0)

## 2021-05-26 LAB — CBC WITH DIFFERENTIAL/PLATELET
Abs Immature Granulocytes: 0.01 10*3/uL (ref 0.00–0.07)
Basophils Absolute: 0.1 10*3/uL (ref 0.0–0.1)
Basophils Relative: 1 %
Eosinophils Absolute: 0.1 10*3/uL (ref 0.0–0.5)
Eosinophils Relative: 2 %
HCT: 49.2 % (ref 39.0–52.0)
Hemoglobin: 15.6 g/dL (ref 13.0–17.0)
Immature Granulocytes: 0 %
Lymphocytes Relative: 20 %
Lymphs Abs: 1.1 10*3/uL (ref 0.7–4.0)
MCH: 26.5 pg (ref 26.0–34.0)
MCHC: 31.7 g/dL (ref 30.0–36.0)
MCV: 83.7 fL (ref 80.0–100.0)
Monocytes Absolute: 0.5 10*3/uL (ref 0.1–1.0)
Monocytes Relative: 9 %
Neutro Abs: 3.7 10*3/uL (ref 1.7–7.7)
Neutrophils Relative %: 68 %
Platelets: 356 10*3/uL (ref 150–400)
RBC: 5.88 MIL/uL — ABNORMAL HIGH (ref 4.22–5.81)
RDW: 13.7 % (ref 11.5–15.5)
WBC: 5.4 10*3/uL (ref 4.0–10.5)
nRBC: 0 % (ref 0.0–0.2)

## 2021-05-26 LAB — BASIC METABOLIC PANEL
Anion gap: 8 (ref 5–15)
BUN: 16 mg/dL (ref 8–23)
CO2: 26 mmol/L (ref 22–32)
Calcium: 9.5 mg/dL (ref 8.9–10.3)
Chloride: 103 mmol/L (ref 98–111)
Creatinine, Ser: 0.85 mg/dL (ref 0.61–1.24)
GFR, Estimated: >60 mL/min (ref >60)
Glucose, Bld: 94 mg/dL (ref 70–99)
Potassium: 4.3 mmol/L (ref 3.5–5.1)
Sodium: 137 mmol/L (ref 135–145)

## 2021-05-26 LAB — PROTIME-INR
INR: 0.9 (ref 0.8–1.2)
Prothrombin Time: 12.4 seconds (ref 11.4–15.2)

## 2021-05-26 LAB — SURGICAL PCR SCREEN
MRSA, PCR: NEGATIVE
Staphylococcus aureus: POSITIVE — AB

## 2021-05-26 LAB — HEMOGLOBIN A1C
Hgb A1c MFr Bld: 5.7 % — ABNORMAL HIGH (ref 4.8–5.6)
Mean Plasma Glucose: 116.89 mg/dL

## 2021-05-26 LAB — APTT: aPTT: 34 seconds (ref 24–36)

## 2021-05-26 LAB — TYPE AND SCREEN
ABO/RH(D): O POS
Antibody Screen: NEGATIVE

## 2021-05-26 NOTE — Progress Notes (Signed)
COVID test Completed:06/04/10  PCP - Dr. Lawerance Bach LOV 01/06/21 Cardiologist - no  Chest x-ray - 03/02/21 -epic EKG - 05/04/21-epic Stress Test - no ECHO - no Cardiac Cath - no Pacemaker/ICD device last checked:NA  Sleep Study - no CPAP -   Fasting Blood Sugar - NA Checks Blood Sugar _____ times a day  Blood Thinner Instructions:NA Aspirin Instructions: Last Dose:  Anesthesia review: no  Patient denies shortness of breath, fever, cough and chest pain at PAT appointment. Yes Pt has no SOB with any activities   Patient verbalized understanding of instructions that were given to them at the PAT appointment. Patient was also instructed that they will need to review over the PAT instructions again at home before surgery. yes

## 2021-06-01 DIAGNOSIS — M25511 Pain in right shoulder: Secondary | ICD-10-CM | POA: Diagnosis not present

## 2021-06-01 DIAGNOSIS — M6281 Muscle weakness (generalized): Secondary | ICD-10-CM | POA: Diagnosis not present

## 2021-06-01 DIAGNOSIS — M25611 Stiffness of right shoulder, not elsewhere classified: Secondary | ICD-10-CM | POA: Diagnosis not present

## 2021-06-02 DIAGNOSIS — Z9889 Other specified postprocedural states: Secondary | ICD-10-CM | POA: Diagnosis not present

## 2021-06-04 ENCOUNTER — Encounter (HOSPITAL_COMMUNITY): Payer: Self-pay | Admitting: Certified Registered"

## 2021-06-04 ENCOUNTER — Other Ambulatory Visit: Payer: Self-pay | Admitting: Orthopaedic Surgery

## 2021-06-04 NOTE — H&P (View-Only) (Signed)
TOTAL KNEE REVISION ADMISSION H&P  Patient is being admitted for right revision total knee arthroplasty.  Subjective:  Chief Complaint:right knee pain.  HPI: Cesar Davis, 65 y.o. male, has a history of pain and functional disability in the right knee(s) due to failed previous arthroplasty and patient has failed non-surgical conservative treatments for greater than 12 weeks to include NSAID's and/or analgesics, flexibility and strengthening excercises, supervised PT with diminished ADL's post treatment, use of assistive devices, weight reduction as appropriate, and activity modification. The indications for the revision of the total knee arthroplasty are loosening of one or more components. Onset of symptoms was abrupt starting 1 years ago with gradually worsening course since that time.  Prior procedures on the right knee(s) include arthroplasty.  Patient currently rates pain in the right knee(s) at 10 out of 10 with activity. There is night pain, worsening of pain with activity and weight bearing, pain that interferes with activities of daily living, and joint swelling.  Patient has evidence of prosthetic loosening by imaging studies. This condition presents safety issues increasing the risk of falls. There is no current active infection.  Patient Active Problem List   Diagnosis Date Noted   S/P ORIF (open reduction internal fixation) fracture 03/11/2021   Shoulder arthritis 09/19/2013   Osteoarthritis of right knee 01/11/2013   Past Medical History:  Diagnosis Date   Arthritis    "right shoulder; knees; left ankle" (01/09/2013)   History of shingles 01/2011   Hypertension    Pre-diabetes    Pt denies    Past Surgical History:  Procedure Laterality Date   ANKLE FRACTURE SURGERY Left 2004?   ARTHROSCOPIC REPAIR PCL Right    FINGER SURGERY Bilateral 1985   "fusion pinky;    INGUINAL HERNIA REPAIR Right 2006   JOINT REPLACEMENT Right    shoulder   KNEE ARTHROSCOPY Right 2010; 12/2012    ORIF HUMERUS FRACTURE Right 03/11/2021   Procedure: OPEN REDUCTION INTERNAL FIXATION RIGHT PERIPROSTHETIC HUMERUS FRACTURE;  Surgeon: Jones Broom, MD;  Location: [facility] ORS;  Service: Orthopedics;  Laterality: Right;   TOTAL KNEE ARTHROPLASTY Right 01/09/2013   TOTAL KNEE ARTHROPLASTY Right 01/09/2013   Procedure: TOTAL KNEE ARTHROPLASTY;  Surgeon: Nestor Lewandowsky, MD;  Location: [facility] OR;  Service: Orthopedics;  Laterality: Right;   TOTAL SHOULDER ARTHROPLASTY Right 09/19/2013   DR CHANDLER    TOTAL SHOULDER ARTHROPLASTY Right 09/19/2013   Procedure: TOTAL SHOULDER ARTHROPLASTY;  Surgeon: Mable Paris, MD;  Location: [facility] OR;  Service: Orthopedics;  Laterality: Right;  REVISION HEMIARTHROPLASTY TO TOTAL SHOULDER   TOTAL SHOULDER REPLACEMENT Right ~ 2006    No current facility-administered medications for this encounter.   Current Outpatient Medications  Medication Sig Dispense Refill Last Dose   Cholecalciferol (VITAMIN D3) 10 MCG (400 UNIT) CAPS Take 400 Units by mouth daily.      DULoxetine (CYMBALTA) 30 MG capsule Take 60 mg by mouth daily.      Ferrous Sulfate 28 MG TABS Take 28 mg by mouth daily.      lisinopril-hydrochlorothiazide (ZESTORETIC) 20-12.5 MG tablet Take 1 tablet by mouth daily.      metoprolol succinate (TOPROL-XL) 50 MG 24 hr tablet Take 50 mg by mouth daily. Take with or immediately following a meal.      Multiple Vitamin (MULTIVITAMIN) capsule Take 3 capsules by mouth daily.      Omega-3 Fatty Acids (OMEGA 3 500) 500 MG CAPS Take 500 mg by mouth daily.      tadalafil (  CIALIS) 5 MG tablet Take 5 mg by mouth daily.      traMADol (ULTRAM) 50 MG tablet Take 50 mg by mouth every 4 (four) hours as needed for pain.      vitamin B-12 (CYANOCOBALAMIN) 1000 MCG tablet Take 1,000 mcg by mouth daily.      aspirin 325 MG EC tablet Take 1 tablet (325 mg total) by mouth daily. (Patient not taking: No sig reported) 30 tablet 0 Not Taking   oxyCODONE-acetaminophen (PERCOCET/ROXICET)  5-325 MG tablet Take 1-2 tablets by mouth every 6 (six) hours as needed for severe pain. (Patient not taking: Reported on 05/26/2021) 30 tablet 0 Not Taking   tiZANidine (ZANAFLEX) 2 MG tablet Take 2 tablets (4 mg total) by mouth every 8 (eight) hours as needed (muscle pain). (Patient not taking: Reported on 05/26/2021) 30 tablet 0 Not Taking   No Known Allergies  Social History   Tobacco Use   Smoking status: Former    Packs/day: 0.50    Years: 15.00    Pack years: 7.50    Types: Cigarettes    Quit date: 09/12/1999    Years since quitting: 21.7   Smokeless tobacco: Current    Types: Snuff  Substance Use Topics   Alcohol use: Not Currently    No family history on file.    Review of Systems  Musculoskeletal:  Positive for arthralgias.       Right knee  All other systems reviewed and are negative.   Objective:  Physical Exam Constitutional:      Appearance: Normal appearance.  HENT:     Head: Normocephalic and atraumatic.     Nose: Nose normal.     Mouth/Throat:     Pharynx: Oropharynx is clear.  Eyes:     Extraocular Movements: Extraocular movements intact.  Cardiovascular:     Rate and Rhythm: Normal rate.  Pulmonary:     Effort: Pulmonary effort is normal.  Abdominal:     Palpations: Abdomen is soft.  Musculoskeletal:     Cervical back: Normal range of motion.     Comments: Left hip is minimally tender at this point with good motion.  At the right knee he still has good motion from 0-115.  He has good stability in extension.  I do not feel an effusion.  His scar is benign.  He has some lateral aspect pain along the proximal tibia.  There is no warmth or redness to the knee.   Skin:    General: Skin is warm and dry.  Neurological:     General: No focal deficit present.     Mental Status: He is alert and oriented to person, place, and time. Mental status is at baseline.  Psychiatric:        Mood and Affect: Mood normal.        Behavior: Behavior normal.         Thought Content: Thought content normal.        Judgment: Judgment normal.    Vital signs in last 24 hours:    Labs:  Estimated body mass index is 29.42 kg/m as calculated from the following:   Height as of 05/26/21: 6\' 1"  (1.854 m).   Weight as of 05/26/21: 101.2 kg.  Imaging Review Plain radiographs demonstrate  no  degenerative joint disease of the right knee(s). The overall alignment is neutral.There is evidence of loosening of the tibial components. The bone quality appears to be good for age and reported activity level. There  is a well aligned TKR prothesis.  Assessment/Plan:  End stage arthritis, right knee(s) with failed previous arthroplasty.   The patient history, physical examination, clinical judgment of the provider and imaging studies are consistent with end stage degenerative joint disease of the right knee(s), previous total knee arthroplasty. Revision total knee arthroplasty is deemed medically necessary. The treatment options including medical management, injection therapy, arthroscopy and revision arthroplasty were discussed at length. The risks and benefits of revision total knee arthroplasty were presented and reviewed. The risks due to aseptic loosening, infection, stiffness, patella tracking problems, thromboembolic complications and other imponderables were discussed. The patient acknowledged the explanation, agreed to proceed with the plan and consent was signed. Patient is being admitted for inpatient treatment for surgery, pain control, PT, OT, prophylactic antibiotics, VTE prophylaxis, progressive ambulation and ADL's and discharge planning.The patient is planning to be discharged home with home health services

## 2021-06-04 NOTE — Progress Notes (Signed)
Called patient about time change for surgery on 06/08/21. He is to arrive 1000 to admitting to get to short stay by 1010. Surgery at 1240. Complete ERAS drink by 0940.

## 2021-06-04 NOTE — H&P (Signed)
TOTAL KNEE REVISION ADMISSION H&P  Patient is being admitted for right revision total knee arthroplasty.  Subjective:  Chief Complaint:right knee pain.  HPI: Cesar Davis, 65 y.o. male, has a history of pain and functional disability in the right knee(s) due to failed previous arthroplasty and patient has failed non-surgical conservative treatments for greater than 12 weeks to include NSAID's and/or analgesics, flexibility and strengthening excercises, supervised PT with diminished ADL's post treatment, use of assistive devices, weight reduction as appropriate, and activity modification. The indications for the revision of the total knee arthroplasty are loosening of one or more components. Onset of symptoms was abrupt starting 1 years ago with gradually worsening course since that time.  Prior procedures on the right knee(s) include arthroplasty.  Patient currently rates pain in the right knee(s) at 10 out of 10 with activity. There is night pain, worsening of pain with activity and weight bearing, pain that interferes with activities of daily living, and joint swelling.  Patient has evidence of prosthetic loosening by imaging studies. This condition presents safety issues increasing the risk of falls. There is no current active infection.  Patient Active Problem List   Diagnosis Date Noted   S/P ORIF (open reduction internal fixation) fracture 03/11/2021   Shoulder arthritis 09/19/2013   Osteoarthritis of right knee 01/11/2013   Past Medical History:  Diagnosis Date   Arthritis    "right shoulder; knees; left ankle" (01/09/2013)   History of shingles 01/2011   Hypertension    Pre-diabetes    Pt denies    Past Surgical History:  Procedure Laterality Date   ANKLE FRACTURE SURGERY Left 2004?   ARTHROSCOPIC REPAIR PCL Right    FINGER SURGERY Bilateral 1985   "fusion pinky;    INGUINAL HERNIA REPAIR Right 2006   JOINT REPLACEMENT Right    shoulder   KNEE ARTHROSCOPY Right 2010; 12/2012    ORIF HUMERUS FRACTURE Right 03/11/2021   Procedure: OPEN REDUCTION INTERNAL FIXATION RIGHT PERIPROSTHETIC HUMERUS FRACTURE;  Surgeon: Jones Broom, MD;  Location: [facility];  Service: Orthopedics;  Laterality: Right;   TOTAL KNEE ARTHROPLASTY Right 01/09/2013   TOTAL KNEE ARTHROPLASTY Right 01/09/2013   Procedure: TOTAL KNEE ARTHROPLASTY;  Surgeon: Nestor Lewandowsky, MD;  Location: [facility] OR;  Service: Orthopedics;  Laterality: Right;   TOTAL SHOULDER ARTHROPLASTY Right 09/19/2013   DR CHANDLER    TOTAL SHOULDER ARTHROPLASTY Right 09/19/2013   Procedure: TOTAL SHOULDER ARTHROPLASTY;  Surgeon: Mable Paris, MD;  Location: [facility] OR;  Service: Orthopedics;  Laterality: Right;  REVISION HEMIARTHROPLASTY TO TOTAL SHOULDER   TOTAL SHOULDER REPLACEMENT Right ~ 2006    No current facility-administered medications for this encounter.   Current Outpatient Medications  Medication Sig Dispense Refill Last Dose   Cholecalciferol (VITAMIN D3) 10 MCG (400 UNIT) CAPS Take 400 Units by mouth daily.      DULoxetine (CYMBALTA) 30 MG capsule Take 60 mg by mouth daily.      Ferrous Sulfate 28 MG TABS Take 28 mg by mouth daily.      lisinopril-hydrochlorothiazide (ZESTORETIC) 20-12.5 MG tablet Take 1 tablet by mouth daily.      metoprolol succinate (TOPROL-XL) 50 MG 24 hr tablet Take 50 mg by mouth daily. Take with or immediately following a meal.      Multiple Vitamin (MULTIVITAMIN) capsule Take 3 capsules by mouth daily.      Omega-3 Fatty Acids (OMEGA 3 500) 500 MG CAPS Take 500 mg by mouth daily.      tadalafil (  CIALIS) 5 MG tablet Take 5 mg by mouth daily.      traMADol (ULTRAM) 50 MG tablet Take 50 mg by mouth every 4 (four) hours as needed for pain.      vitamin B-12 (CYANOCOBALAMIN) 1000 MCG tablet Take 1,000 mcg by mouth daily.      aspirin 325 MG EC tablet Take 1 tablet (325 mg total) by mouth daily. (Patient not taking: No sig reported) 30 tablet 0 Not Taking   oxyCODONE-acetaminophen (PERCOCET/ROXICET)  5-325 MG tablet Take 1-2 tablets by mouth every 6 (six) hours as needed for severe pain. (Patient not taking: Reported on 05/26/2021) 30 tablet 0 Not Taking   tiZANidine (ZANAFLEX) 2 MG tablet Take 2 tablets (4 mg total) by mouth every 8 (eight) hours as needed (muscle pain). (Patient not taking: Reported on 05/26/2021) 30 tablet 0 Not Taking   No Known Allergies  Social History   Tobacco Use   Smoking status: Former    Packs/day: 0.50    Years: 15.00    Pack years: 7.50    Types: Cigarettes    Quit date: 09/12/1999    Years since quitting: 21.7   Smokeless tobacco: Current    Types: Snuff  Substance Use Topics   Alcohol use: Not Currently    No family history on file.    Review of Systems  Musculoskeletal:  Positive for arthralgias.       Right knee  All other systems reviewed and are negative.   Objective:  Physical Exam Constitutional:      Appearance: Normal appearance.  HENT:     Head: Normocephalic and atraumatic.     Nose: Nose normal.     Mouth/Throat:     Pharynx: Oropharynx is clear.  Eyes:     Extraocular Movements: Extraocular movements intact.  Cardiovascular:     Rate and Rhythm: Normal rate.  Pulmonary:     Effort: Pulmonary effort is normal.  Abdominal:     Palpations: Abdomen is soft.  Musculoskeletal:     Cervical back: Normal range of motion.     Comments: Left hip is minimally tender at this point with good motion.  At the right knee he still has good motion from 0-115.  He has good stability in extension.  I do not feel an effusion.  His scar is benign.  He has some lateral aspect pain along the proximal tibia.  There is no warmth or redness to the knee.   Skin:    General: Skin is warm and dry.  Neurological:     General: No focal deficit present.     Mental Status: He is alert and oriented to person, place, and time. Mental status is at baseline.  Psychiatric:        Mood and Affect: Mood normal.        Behavior: Behavior normal.         Thought Content: Thought content normal.        Judgment: Judgment normal.    Vital signs in last 24 hours:    Labs:  Estimated body mass index is 29.42 kg/m as calculated from the following:   Height as of 05/26/21: 6\' 1"  (1.854 m).   Weight as of 05/26/21: 101.2 kg.  Imaging Review Plain radiographs demonstrate  no  degenerative joint disease of the right knee(s). The overall alignment is neutral.There is evidence of loosening of the tibial components. The bone quality appears to be good for age and reported activity level. There  is a well aligned TKR prothesis.  Assessment/Plan:  End stage arthritis, right knee(s) with failed previous arthroplasty.   The patient history, physical examination, clinical judgment of the provider and imaging studies are consistent with end stage degenerative joint disease of the right knee(s), previous total knee arthroplasty. Revision total knee arthroplasty is deemed medically necessary. The treatment options including medical management, injection therapy, arthroscopy and revision arthroplasty were discussed at length. The risks and benefits of revision total knee arthroplasty were presented and reviewed. The risks due to aseptic loosening, infection, stiffness, patella tracking problems, thromboembolic complications and other imponderables were discussed. The patient acknowledged the explanation, agreed to proceed with the plan and consent was signed. Patient is being admitted for inpatient treatment for surgery, pain control, PT, OT, prophylactic antibiotics, VTE prophylaxis, progressive ambulation and ADL's and discharge planning.The patient is planning to be discharged home with home health services

## 2021-06-05 LAB — SARS CORONAVIRUS 2 (TAT 6-24 HRS): SARS Coronavirus 2: POSITIVE — AB

## 2021-06-05 NOTE — Progress Notes (Signed)
Notified Dr Jerl Santos that patient had + covid test on 9/2.  Scheduled for surgery on 9/6.

## 2021-06-07 MED ORDER — TRANEXAMIC ACID 1000 MG/10ML IV SOLN
2000.0000 mg | INTRAVENOUS | Status: DC
  Filled 2021-06-07: qty 20

## 2021-06-08 ENCOUNTER — Other Ambulatory Visit: Payer: Self-pay | Admitting: Orthopaedic Surgery

## 2021-06-08 ENCOUNTER — Encounter (HOSPITAL_COMMUNITY): Admission: RE | Payer: Self-pay | Source: Home / Self Care

## 2021-06-08 ENCOUNTER — Inpatient Hospital Stay (HOSPITAL_COMMUNITY)
Admission: RE | Admit: 2021-06-08 | Payer: BC Managed Care – PPO | Source: Home / Self Care | Admitting: Orthopaedic Surgery

## 2021-06-08 DIAGNOSIS — Z01818 Encounter for other preprocedural examination: Secondary | ICD-10-CM

## 2021-06-08 SURGERY — TOTAL KNEE REVISION
Anesthesia: Spinal | Site: Knee | Laterality: Right

## 2021-06-08 NOTE — Progress Notes (Signed)
Patient and spouse here for OR and angry due to no call. Discovered messages left on wrong number this am. Apologized to patient and wife. Remain angry. Wife hung up on me. They were on another line with the office to reschedule.

## 2021-06-08 NOTE — Progress Notes (Signed)
Dr Dalldorf aware of Covid + result. Surgery is cancelled. Call to patient and spouse -- messages left to contact the office to reschedule surgery. 

## 2021-06-15 NOTE — Patient Instructions (Signed)
DUE TO COVID-19 ONLY ONE VISITOR IS ALLOWED TO COME WITH YOU AND STAY IN THE WAITING ROOM ONLY DURING PRE OP AND PROCEDURE.   **NO VISITORS ARE ALLOWED IN THE SHORT STAY AREA OR RECOVERY ROOM!!**  IF YOU WILL BE ADMITTED INTO THE HOSPITAL YOU ARE ALLOWED ONLY TWO SUPPORT PEOPLE DURING VISITATION HOURS ONLY (10AM -8PM)   The support person(s) may change daily. The support person(s) must pass our screening, gel in and out, and wear a mask at all times, including in the patient's room. Patients must also wear a mask when staff or their support person are in the room.  No visitors under the age of 63. Any visitor under the age of 20 must be accompanied by an adult.        Your procedure is scheduled on: 06/29/21   Report to [facility] Main  Entrance    Report to admitting at: 3:30 PM   Call this number if you have problems the morning of surgery [phone]   Do not eat food :After Midnight.   May have liquids until : 3:00 PM.   day of surgery  CLEAR LIQUID DIET  Foods Allowed                                                                     Foods Excluded  Water, Black Coffee and tea, regular and decaf                             liquids that you cannot  Plain Jell-O in any flavor  (No red)                                           see through such as: Fruit ices (not with fruit pulp)                                     milk, soups, orange juice              Iced Popsicles (No red)                                    All solid food                                   Apple juices Sports drinks like Gatorade (No red) Lightly seasoned clear broth or consume(fat free) Sugar,   Sample Menu Breakfast                                Lunch                                     Supper Cranberry juice  Beef broth                            Chicken broth Jell-O                                     Grape juice                           Apple juice Coffee or tea                         Jell-O                                      Popsicle                                                Coffee or tea                        Coffee or tea      Complete one Gatorade drink the day of surgery at: 3:00 PM.      the day of surgery.     The day of surgery:  Drink ONE (1) Pre-Surgery Clear Ensure or G2 by am the morning of surgery. Drink in one sitting. Do not sip.  This drink was given to you during your hospital  pre-op appointment visit. Nothing else to drink after completing the  Pre-Surgery Clear Ensure or G2.          If you have questions, please contact your surgeon's office.     Oral Hygiene is also important to reduce your risk of infection.                                    Remember - BRUSH YOUR TEETH THE MORNING OF SURGERY WITH YOUR REGULAR TOOTHPASTE   Do NOT smoke after Midnight   Take these medicines the morning of surgery with A SIP OF WATER: duloxetine,metoprolol.  DO NOT TAKE ANY ORAL DIABETIC MEDICATIONS DAY OF YOUR SURGERY                              You may not have any metal on your body including hair pins, jewelry, and body piercing             Do not wear lotions, powders, perfumes/cologne, or deodorant.              Men may shave face and neck.   Do not bring valuables to the hospital. [facility] IS NOT             RESPONSIBLE   FOR VALUABLES.   Contacts, dentures or bridgework may not be worn into surgery.   Bring small overnight bag day of surgery.    Patients discharged the day of surgery will not be allowed to drive home.   Special Instructions: Bring a copy of your healthcare power of  attorney and living will documents         the day of surgery if you haven't scanned them in before.              Please read over the following fact sheets you were given: IF YOU HAVE QUESTIONS ABOUT YOUR PRE OP INSTRUCTIONS PLEASE CALL [phone]   [facility] - Preparing for Surgery Before surgery, you can play an important role.   Because skin is not sterile, your skin needs to be as free of germs as possible.  You can reduce the number of germs on your skin by washing with CHG (chlorahexidine gluconate) soap before surgery.  CHG is an antiseptic cleaner which kills germs and bonds with the skin to continue killing germs even after washing. Please DO NOT use if you have an allergy to CHG or antibacterial soaps.  If your skin becomes reddened/irritated stop using the CHG and inform your nurse when you arrive at Short Stay. Do not shave (including legs and underarms) for at least 48 hours prior to the first CHG shower.  You may shave your face/neck. Please follow these instructions carefully:  1.  Shower with CHG Soap the night before surgery and the  morning of Surgery.  2.  If you choose to wash your hair, wash your hair first as usual with your  normal  shampoo.  3.  After you shampoo, rinse your hair and body thoroughly to remove the  shampoo.                           4.  Use CHG as you would any other liquid soap.  You can apply chg directly  to the skin and wash                       Gently with a scrungie or clean washcloth.  5.  Apply the CHG Soap to your body ONLY FROM THE NECK DOWN.   Do not use on face/ open                           Wound or open sores. Avoid contact with eyes, ears mouth and genitals (private parts).                       Wash face,  Genitals (private parts) with your normal soap.             6.  Wash thoroughly, paying special attention to the area where your surgery  will be performed.  7.  Thoroughly rinse your body with warm water from the neck down.  8.  DO NOT shower/wash with your normal soap after using and rinsing off  the CHG Soap.                9.  Pat yourself dry with a clean towel.            10.  Wear clean pajamas.            11.  Place clean sheets on your bed the night of your first shower and do not  sleep with pets. Day of Surgery : Do not apply any lotions/deodorants the  morning of surgery.  Please wear clean clothes to the hospital/surgery center.  FAILURE TO FOLLOW THESE INSTRUCTIONS MAY RESULT IN THE CANCELLATION OF YOUR SURGERY PATIENT SIGNATURE_________________________________  NURSE SIGNATURE__________________________________  ________________________________________________________________________   Cesar Davis  An incentive spirometer is a tool that can help keep your lungs clear and active. This tool measures how well you are filling your lungs with each breath. Taking long deep breaths may help reverse or decrease the chance of developing breathing (pulmonary) problems (especially infection) following: A long period of time when you are unable to move or be active. BEFORE THE PROCEDURE  If the spirometer includes an indicator to show your best effort, your nurse or respiratory therapist will set it to a desired goal. If possible, sit up straight or lean slightly forward. Try not to slouch. Hold the incentive spirometer in an upright position. INSTRUCTIONS FOR USE  Sit on the edge of your bed if possible, or sit up as far as you can in bed or on a chair. Hold the incentive spirometer in an upright position. Breathe out normally. Place the mouthpiece in your mouth and seal your lips tightly around it. Breathe in slowly and as deeply as possible, raising the piston or the ball toward the top of the column. Hold your breath for 3-5 seconds or for as long as possible. Allow the piston or ball to fall to the bottom of the column. Remove the mouthpiece from your mouth and breathe out normally. Rest for a few seconds and repeat Steps 1 through 7 at least 10 times every 1-2 hours when you are awake. Take your time and take a few normal breaths between deep breaths. The spirometer may include an indicator to show your best effort. Use the indicator as a goal to work toward during each repetition. After each set of 10 deep breaths, practice  coughing to be sure your lungs are clear. If you have an incision (the cut made at the time of surgery), support your incision when coughing by placing a pillow or rolled up towels firmly against it. Once you are able to get out of bed, walk around indoors and cough well. You may stop using the incentive spirometer when instructed by your caregiver.  RISKS AND COMPLICATIONS Take your time so you do not get dizzy or light-headed. If you are in pain, you may need to take or ask for pain medication before doing incentive spirometry. It is harder to take a deep breath if you are having pain. AFTER USE Rest and breathe slowly and easily. It can be helpful to keep track of a log of your progress. Your caregiver can provide you with a simple table to help with this. If you are using the spirometer at home, follow these instructions: SEEK MEDICAL CARE IF:  You are having difficultly using the spirometer. You have trouble using the spirometer as often as instructed. Your pain medication is not giving enough relief while using the spirometer. You develop fever of 100.5 F (38.1 C) or higher. SEEK IMMEDIATE MEDICAL CARE IF:  You cough up bloody sputum that had not been present before. You develop fever of 102 F (38.9 C) or greater. You develop worsening pain at or near the incision site. MAKE SURE YOU:  Understand these instructions. Will watch your condition. Will get help right away if you are not doing well or get worse. Document Released: 01/30/2007 Document Revised: 12/12/2011 Document Reviewed: 04/02/2007 [facility] Patient Information 2014 [place], [place].   ________________________________________________________________________

## 2021-06-16 ENCOUNTER — Encounter (HOSPITAL_COMMUNITY): Payer: Self-pay

## 2021-06-16 ENCOUNTER — Other Ambulatory Visit: Payer: Self-pay

## 2021-06-16 ENCOUNTER — Encounter (HOSPITAL_COMMUNITY)
Admission: RE | Admit: 2021-06-16 | Discharge: 2021-06-16 | Disposition: A | Discharge status: Home / Self Care | Payer: BC Managed Care – PPO | Source: Ambulatory Visit | Attending: Orthopaedic Surgery | Admitting: Orthopaedic Surgery

## 2021-06-16 DIAGNOSIS — Z01818 Encounter for other preprocedural examination: Secondary | ICD-10-CM | POA: Insufficient documentation

## 2021-06-16 LAB — PROTIME-INR
INR: 0.9 (ref 0.8–1.2)
Prothrombin Time: 12.2 seconds (ref 11.4–15.2)

## 2021-06-16 LAB — CBC WITH DIFFERENTIAL/PLATELET
Abs Immature Granulocytes: 0.02 10*3/uL (ref 0.00–0.07)
Basophils Absolute: 0.1 10*3/uL (ref 0.0–0.1)
Basophils Relative: 1 %
Eosinophils Absolute: 0.1 10*3/uL (ref 0.0–0.5)
Eosinophils Relative: 1 %
HCT: 47.3 % (ref 39.0–52.0)
Hemoglobin: 15.2 g/dL (ref 13.0–17.0)
Immature Granulocytes: 0 %
Lymphocytes Relative: 22 %
Lymphs Abs: 1.2 10*3/uL (ref 0.7–4.0)
MCH: 26.4 pg (ref 26.0–34.0)
MCHC: 32.1 g/dL (ref 30.0–36.0)
MCV: 82.3 fL (ref 80.0–100.0)
Monocytes Absolute: 0.5 10*3/uL (ref 0.1–1.0)
Monocytes Relative: 9 %
Neutro Abs: 3.8 10*3/uL (ref 1.7–7.7)
Neutrophils Relative %: 67 %
Platelets: 306 10*3/uL (ref 150–400)
RBC: 5.75 MIL/uL (ref 4.22–5.81)
RDW: 14 % (ref 11.5–15.5)
WBC: 5.6 10*3/uL (ref 4.0–10.5)
nRBC: 0 % (ref 0.0–0.2)

## 2021-06-16 LAB — BASIC METABOLIC PANEL
Anion gap: 8 (ref 5–15)
BUN: 15 mg/dL (ref 8–23)
CO2: 24 mmol/L (ref 22–32)
Calcium: 8.9 mg/dL (ref 8.9–10.3)
Chloride: 104 mmol/L (ref 98–111)
Creatinine, Ser: 0.87 mg/dL (ref 0.61–1.24)
GFR, Estimated: >60 mL/min (ref >60)
Glucose, Bld: 101 mg/dL — ABNORMAL HIGH (ref 70–99)
Potassium: 4 mmol/L (ref 3.5–5.1)
Sodium: 136 mmol/L (ref 135–145)

## 2021-06-16 LAB — TYPE AND SCREEN
ABO/RH(D): O POS
Antibody Screen: NEGATIVE

## 2021-06-16 LAB — URINALYSIS, ROUTINE W REFLEX MICROSCOPIC
Bilirubin Urine: NEGATIVE
Glucose, UA: NEGATIVE mg/dL
Hgb urine dipstick: NEGATIVE
Ketones, ur: NEGATIVE mg/dL
Leukocytes,Ua: NEGATIVE
Nitrite: NEGATIVE
Protein, ur: NEGATIVE mg/dL
Specific Gravity, Urine: 1.015 (ref 1.005–1.030)
pH: 6 (ref 5.0–8.0)

## 2021-06-16 LAB — APTT: aPTT: 32 seconds (ref 24–36)

## 2021-06-16 LAB — GLUCOSE, CAPILLARY: Glucose-Capillary: 112 mg/dL — ABNORMAL HIGH (ref 70–99)

## 2021-06-16 NOTE — Progress Notes (Signed)
COVID Vaccine Completed: Yes Date COVID Vaccine completed: 09/04/20. X 3 COVID vaccine manufacturer: Pfizer     COVID Test: N/A.   PCP - Dr. Forrest Moron Cardiologist -   Chest x-ray - 03/02/21 EKG -  Stress Test -  ECHO -  Cardiac Cath -  Pacemaker/ICD device last checked: A1C: 5.7: 05/26/21 Sleep Study -  CPAP -   Fasting Blood Sugar -  Checks Blood Sugar _____ times a day  Blood Thinner Instructions: Aspirin Instructions: It has been held. Last Dose:  Anesthesia review: Hx: HTN,Pre-DIA.  Patient denies shortness of breath, fever, cough and chest pain at PAT appointment   Patient verbalized understanding of instructions that were given to them at the PAT appointment. Patient was also instructed that they will need to review over the PAT instructions again at home before surgery.

## 2021-06-17 NOTE — Progress Notes (Signed)
Final ekg done 06/16/21 in epic.  

## 2021-06-28 MED ORDER — TRANEXAMIC ACID 1000 MG/10ML IV SOLN
2000.0000 mg | INTRAVENOUS | Status: DC
  Filled 2021-06-28: qty 20

## 2021-06-29 ENCOUNTER — Inpatient Hospital Stay (HOSPITAL_COMMUNITY): Payer: BC Managed Care – PPO | Admitting: Anesthesiology

## 2021-06-29 ENCOUNTER — Encounter (HOSPITAL_COMMUNITY): Payer: Self-pay | Admitting: Orthopaedic Surgery

## 2021-06-29 ENCOUNTER — Encounter (HOSPITAL_COMMUNITY)
Admission: RE | Disposition: A | Discharge status: Home Health | Payer: Self-pay | Source: Ambulatory Visit | Attending: Orthopaedic Surgery

## 2021-06-29 ENCOUNTER — Inpatient Hospital Stay (HOSPITAL_COMMUNITY)
Admission: RE | Admit: 2021-06-29 | Discharge: 2021-06-30 | DRG: 489 | Disposition: A | Discharge status: Home Health | Payer: BC Managed Care – PPO | Source: Ambulatory Visit | Attending: Orthopaedic Surgery | Admitting: Orthopaedic Surgery

## 2021-06-29 DIAGNOSIS — T8484XA Pain due to internal orthopedic prosthetic devices, implants and grafts, initial encounter: Secondary | ICD-10-CM | POA: Diagnosis present

## 2021-06-29 DIAGNOSIS — Z87891 Personal history of nicotine dependence: Secondary | ICD-10-CM

## 2021-06-29 DIAGNOSIS — T84032A Mechanical loosening of internal right knee prosthetic joint, initial encounter: Principal | ICD-10-CM | POA: Diagnosis present

## 2021-06-29 DIAGNOSIS — Z96651 Presence of right artificial knee joint: Secondary | ICD-10-CM | POA: Diagnosis not present

## 2021-06-29 DIAGNOSIS — Z96652 Presence of left artificial knee joint: Secondary | ICD-10-CM | POA: Diagnosis present

## 2021-06-29 DIAGNOSIS — I10 Essential (primary) hypertension: Secondary | ICD-10-CM | POA: Diagnosis not present

## 2021-06-29 DIAGNOSIS — Z7982 Long term (current) use of aspirin: Secondary | ICD-10-CM | POA: Diagnosis not present

## 2021-06-29 DIAGNOSIS — Z79899 Other long term (current) drug therapy: Secondary | ICD-10-CM

## 2021-06-29 DIAGNOSIS — Z96611 Presence of right artificial shoulder joint: Secondary | ICD-10-CM | POA: Diagnosis not present

## 2021-06-29 DIAGNOSIS — Y831 Surgical operation with implant of artificial internal device as the cause of abnormal reaction of the patient, or of later complication, without mention of misadventure at the time of the procedure: Secondary | ICD-10-CM | POA: Diagnosis not present

## 2021-06-29 DIAGNOSIS — M1990 Unspecified osteoarthritis, unspecified site: Secondary | ICD-10-CM | POA: Diagnosis not present

## 2021-06-29 DIAGNOSIS — G8918 Other acute postprocedural pain: Secondary | ICD-10-CM | POA: Diagnosis not present

## 2021-06-29 DIAGNOSIS — Z01818 Encounter for other preprocedural examination: Secondary | ICD-10-CM

## 2021-06-29 HISTORY — PX: TOTAL KNEE REVISION: SHX996

## 2021-06-29 LAB — GLUCOSE, CAPILLARY
Glucose-Capillary: 110 mg/dL — ABNORMAL HIGH (ref 70–99)
Glucose-Capillary: 104 mg/dL — ABNORMAL HIGH (ref 70–99)

## 2021-06-29 SURGERY — TOTAL KNEE REVISION
Anesthesia: Spinal | Site: Knee | Laterality: Right

## 2021-06-29 MED ORDER — PROPOFOL 10 MG/ML IV BOLUS
INTRAVENOUS | Status: AC
  Filled 2021-06-29: qty 20

## 2021-06-29 MED ORDER — ACETAMINOPHEN 500 MG PO TABS
500.0000 mg | ORAL_TABLET | Freq: Four times a day (QID) | ORAL | Status: DC
  Administered 2021-06-29 – 2021-06-30 (×2): 500 mg via ORAL
  Filled 2021-06-29 (×2): qty 1

## 2021-06-29 MED ORDER — METOCLOPRAMIDE HCL 5 MG/ML IJ SOLN: 5.0000 mg | Freq: Three times a day (TID) | INTRAMUSCULAR | Status: DC | PRN

## 2021-06-29 MED ORDER — PROMETHAZINE HCL 25 MG/ML IJ SOLN: 6.2500 mg | INTRAMUSCULAR | Status: DC | PRN

## 2021-06-29 MED ORDER — DEXAMETHASONE SODIUM PHOSPHATE 10 MG/ML IJ SOLN
INTRAMUSCULAR | Status: AC
  Filled 2021-06-29: qty 1

## 2021-06-29 MED ORDER — ONDANSETRON HCL 4 MG PO TABS
4.0000 mg | ORAL_TABLET | Freq: Four times a day (QID) | ORAL | Status: DC | PRN
Start: 2021-06-29 — End: 2021-06-30

## 2021-06-29 MED ORDER — SODIUM CHLORIDE 0.9 % IR SOLN
Status: DC | PRN
  Administered 2021-06-29: 3000 mL

## 2021-06-29 MED ORDER — METHOCARBAMOL 500 MG PO TABS
500.0000 mg | ORAL_TABLET | Freq: Four times a day (QID) | ORAL | Status: DC | PRN
Start: 2021-06-29 — End: 2021-06-30
  Administered 2021-06-29: 500 mg via ORAL
  Filled 2021-06-29: qty 1

## 2021-06-29 MED ORDER — MEPERIDINE HCL 50 MG/ML IJ SOLN: 6.2500 mg | INTRAMUSCULAR | Status: DC | PRN

## 2021-06-29 MED ORDER — ACETAMINOPHEN 500 MG PO TABS
ORAL_TABLET | ORAL | Status: AC
  Filled 2021-06-29: qty 2

## 2021-06-29 MED ORDER — BUPIVACAINE LIPOSOME 1.3 % IJ SUSP: 20.0000 mL | Freq: Once | INTRAMUSCULAR | Status: DC

## 2021-06-29 MED ORDER — MENTHOL 3 MG MT LOZG: 1.0000 | LOZENGE | OROMUCOSAL | Status: DC | PRN

## 2021-06-29 MED ORDER — POVIDONE-IODINE 10 % EX SWAB
2.0000 "application " | Freq: Once | CUTANEOUS | Status: AC
  Administered 2021-06-29: 2 via TOPICAL

## 2021-06-29 MED ORDER — MIDAZOLAM HCL 2 MG/2ML IJ SOLN
1.0000 mg | INTRAMUSCULAR | Status: DC
Start: 2021-06-29 — End: 2021-06-29
  Administered 2021-06-29: 2 mg via INTRAVENOUS
  Filled 2021-06-29: qty 2

## 2021-06-29 MED ORDER — TRANEXAMIC ACID-NACL 1000-0.7 MG/100ML-% IV SOLN
1000.0000 mg | INTRAVENOUS | Status: AC
  Administered 2021-06-29: 1000 mg via INTRAVENOUS
  Filled 2021-06-29: qty 100

## 2021-06-29 MED ORDER — HYDROCODONE-ACETAMINOPHEN 5-325 MG PO TABS
1.0000 | ORAL_TABLET | ORAL | Status: DC | PRN
  Administered 2021-06-29: 2 via ORAL
  Filled 2021-06-29: qty 2

## 2021-06-29 MED ORDER — SODIUM CHLORIDE (PF) 0.9 % IJ SOLN
INTRAMUSCULAR | Status: AC
  Filled 2021-06-29: qty 30

## 2021-06-29 MED ORDER — DULOXETINE HCL 60 MG PO CPEP
60.0000 mg | ORAL_CAPSULE | Freq: Every day | ORAL | Status: DC
  Administered 2021-06-30: 60 mg via ORAL
  Filled 2021-06-29: qty 1

## 2021-06-29 MED ORDER — ONDANSETRON HCL 4 MG/2ML IJ SOLN: 4.0000 mg | Freq: Four times a day (QID) | INTRAMUSCULAR | Status: DC | PRN

## 2021-06-29 MED ORDER — ROPIVACAINE HCL 5 MG/ML IJ SOLN
INTRAMUSCULAR | Status: DC | PRN
  Administered 2021-06-29: 20 mL via PERINEURAL

## 2021-06-29 MED ORDER — METOPROLOL SUCCINATE ER 50 MG PO TB24
50.0000 mg | ORAL_TABLET | Freq: Every day | ORAL | Status: DC
  Administered 2021-06-30: 50 mg via ORAL
  Filled 2021-06-29: qty 1

## 2021-06-29 MED ORDER — OMEGA 3 500 500 MG PO CAPS: 500.0000 mg | ORAL_CAPSULE | Freq: Every day | ORAL | Status: DC

## 2021-06-29 MED ORDER — LACTATED RINGERS IV SOLN: INTRAVENOUS | Status: DC

## 2021-06-29 MED ORDER — PHENOL 1.4 % MT LIQD: 1.0000 | OROMUCOSAL | Status: DC | PRN

## 2021-06-29 MED ORDER — BUPIVACAINE-EPINEPHRINE 0.25% -1:200000 IJ SOLN
INTRAMUSCULAR | Status: DC | PRN
  Administered 2021-06-29: 30 mL

## 2021-06-29 MED ORDER — BUPIVACAINE-EPINEPHRINE (PF) 0.25% -1:200000 IJ SOLN
INTRAMUSCULAR | Status: AC
  Filled 2021-06-29: qty 30

## 2021-06-29 MED ORDER — ASPIRIN 81 MG PO CHEW
81.0000 mg | CHEWABLE_TABLET | Freq: Two times a day (BID) | ORAL | Status: DC
  Administered 2021-06-30: 81 mg via ORAL
  Filled 2021-06-29: qty 1

## 2021-06-29 MED ORDER — PROPOFOL 500 MG/50ML IV EMUL
INTRAVENOUS | Status: AC
  Filled 2021-06-29: qty 50

## 2021-06-29 MED ORDER — PROPOFOL 500 MG/50ML IV EMUL
INTRAVENOUS | Status: DC | PRN
  Administered 2021-06-29: 75 ug/kg/min via INTRAVENOUS

## 2021-06-29 MED ORDER — DOCUSATE SODIUM 100 MG PO CAPS
100.0000 mg | ORAL_CAPSULE | Freq: Two times a day (BID) | ORAL | Status: DC
  Administered 2021-06-29 – 2021-06-30 (×2): 100 mg via ORAL
  Filled 2021-06-29 (×2): qty 1

## 2021-06-29 MED ORDER — DIPHENHYDRAMINE HCL 12.5 MG/5ML PO ELIX
12.5000 mg | ORAL_SOLUTION | ORAL | Status: DC | PRN
  Administered 2021-06-30: 12.5 mg via ORAL
  Filled 2021-06-29: qty 5

## 2021-06-29 MED ORDER — CEFAZOLIN SODIUM-DEXTROSE 2-4 GM/100ML-% IV SOLN
2.0000 g | Freq: Four times a day (QID) | INTRAVENOUS | Status: AC
  Administered 2021-06-29 – 2021-06-30 (×2): 2 g via INTRAVENOUS
  Filled 2021-06-29 (×2): qty 100

## 2021-06-29 MED ORDER — ACETAMINOPHEN 500 MG PO TABS
1000.0000 mg | ORAL_TABLET | Freq: Once | ORAL | Status: AC
  Administered 2021-06-29: 1000 mg via ORAL

## 2021-06-29 MED ORDER — LISINOPRIL 20 MG PO TABS
20.0000 mg | ORAL_TABLET | Freq: Every day | ORAL | Status: DC
  Administered 2021-06-30: 20 mg via ORAL
  Filled 2021-06-29: qty 1

## 2021-06-29 MED ORDER — MORPHINE SULFATE (PF) 2 MG/ML IV SOLN
0.5000 mg | INTRAVENOUS | Status: DC | PRN
  Administered 2021-06-30: 0.5 mg via INTRAVENOUS
  Filled 2021-06-29: qty 1

## 2021-06-29 MED ORDER — ACETAMINOPHEN 325 MG PO TABS: 325.0000 mg | ORAL_TABLET | Freq: Four times a day (QID) | ORAL | Status: DC | PRN

## 2021-06-29 MED ORDER — BUPIVACAINE IN DEXTROSE 0.75-8.25 % IT SOLN
INTRATHECAL | Status: DC | PRN
  Administered 2021-06-29: 2 mL via INTRATHECAL

## 2021-06-29 MED ORDER — TRANEXAMIC ACID-NACL 1000-0.7 MG/100ML-% IV SOLN
1000.0000 mg | Freq: Once | INTRAVENOUS | Status: AC
  Administered 2021-06-29: 1000 mg via INTRAVENOUS
  Filled 2021-06-29: qty 100

## 2021-06-29 MED ORDER — TRANEXAMIC ACID 1000 MG/10ML IV SOLN
INTRAVENOUS | Status: DC | PRN
  Administered 2021-06-29: 2000 mg via TOPICAL

## 2021-06-29 MED ORDER — OXYCODONE HCL 5 MG PO TABS: 5.0000 mg | ORAL_TABLET | Freq: Once | ORAL | Status: DC | PRN

## 2021-06-29 MED ORDER — STERILE WATER FOR IRRIGATION IR SOLN
Status: DC | PRN
  Administered 2021-06-29: 2000 mL

## 2021-06-29 MED ORDER — HYDROMORPHONE HCL 1 MG/ML IJ SOLN
0.2500 mg | INTRAMUSCULAR | Status: DC | PRN
  Administered 2021-06-29: 0.25 mg via INTRAVENOUS
  Administered 2021-06-29: 0.5 mg via INTRAVENOUS
  Administered 2021-06-29: 0.25 mg via INTRAVENOUS

## 2021-06-29 MED ORDER — CHLORHEXIDINE GLUCONATE 0.12 % MT SOLN
15.0000 mL | Freq: Once | OROMUCOSAL | Status: AC
  Administered 2021-06-29: 15 mL via OROMUCOSAL

## 2021-06-29 MED ORDER — LISINOPRIL-HYDROCHLOROTHIAZIDE 20-12.5 MG PO TABS: 1.0000 | ORAL_TABLET | Freq: Every day | ORAL | Status: DC

## 2021-06-29 MED ORDER — KETOROLAC TROMETHAMINE 15 MG/ML IJ SOLN
15.0000 mg | Freq: Four times a day (QID) | INTRAMUSCULAR | Status: DC
  Administered 2021-06-29 – 2021-06-30 (×2): 15 mg via INTRAVENOUS
  Filled 2021-06-29 (×2): qty 1

## 2021-06-29 MED ORDER — BUPIVACAINE LIPOSOME 1.3 % IJ SUSP
INTRAMUSCULAR | Status: AC
  Filled 2021-06-29: qty 20

## 2021-06-29 MED ORDER — ONDANSETRON HCL 4 MG/2ML IJ SOLN
INTRAMUSCULAR | Status: AC
  Filled 2021-06-29: qty 2

## 2021-06-29 MED ORDER — OMEGA-3-ACID ETHYL ESTERS 1 G PO CAPS
1.0000 g | ORAL_CAPSULE | Freq: Every day | ORAL | Status: DC
  Administered 2021-06-30: 1 g via ORAL
  Filled 2021-06-29: qty 1

## 2021-06-29 MED ORDER — FENTANYL CITRATE PF 50 MCG/ML IJ SOSY
50.0000 ug | PREFILLED_SYRINGE | INTRAMUSCULAR | Status: DC
  Administered 2021-06-29: 100 ug via INTRAVENOUS
  Filled 2021-06-29: qty 2

## 2021-06-29 MED ORDER — DEXAMETHASONE SODIUM PHOSPHATE 10 MG/ML IJ SOLN
INTRAMUSCULAR | Status: DC | PRN
  Administered 2021-06-29: 5 mg via INTRAVENOUS

## 2021-06-29 MED ORDER — BUPIVACAINE LIPOSOME 1.3 % IJ SUSP
INTRAMUSCULAR | Status: DC | PRN
  Administered 2021-06-29: 20 mL

## 2021-06-29 MED ORDER — CEFAZOLIN SODIUM-DEXTROSE 2-4 GM/100ML-% IV SOLN
2.0000 g | INTRAVENOUS | Status: AC
Start: 2021-06-30 — End: 2021-06-29
  Administered 2021-06-29: 2 g via INTRAVENOUS
  Filled 2021-06-29: qty 100

## 2021-06-29 MED ORDER — MIDAZOLAM HCL 2 MG/2ML IJ SOLN: 0.5000 mg | Freq: Once | INTRAMUSCULAR | Status: DC | PRN

## 2021-06-29 MED ORDER — SODIUM CHLORIDE (PF) 0.9 % IJ SOLN
INTRAMUSCULAR | Status: DC | PRN
  Administered 2021-06-29: 30 mL

## 2021-06-29 MED ORDER — HYDROCHLOROTHIAZIDE 12.5 MG PO CAPS
12.5000 mg | ORAL_CAPSULE | Freq: Every day | ORAL | Status: DC
  Administered 2021-06-30: 12.5 mg via ORAL
  Filled 2021-06-29: qty 1

## 2021-06-29 MED ORDER — 0.9 % SODIUM CHLORIDE (POUR BTL) OPTIME
TOPICAL | Status: DC | PRN
  Administered 2021-06-29: 1000 mL

## 2021-06-29 MED ORDER — METOCLOPRAMIDE HCL 5 MG PO TABS: 5.0000 mg | ORAL_TABLET | Freq: Three times a day (TID) | ORAL | Status: DC | PRN

## 2021-06-29 MED ORDER — PROPOFOL 10 MG/ML IV BOLUS
INTRAVENOUS | Status: DC | PRN
  Administered 2021-06-29 (×2): 30 mg via INTRAVENOUS
  Administered 2021-06-29 (×2): 20 mg via INTRAVENOUS

## 2021-06-29 MED ORDER — HYDROMORPHONE HCL 1 MG/ML IJ SOLN
INTRAMUSCULAR | Status: AC
  Filled 2021-06-29: qty 1

## 2021-06-29 MED ORDER — HYDROCODONE-ACETAMINOPHEN 7.5-325 MG PO TABS
1.0000 | ORAL_TABLET | ORAL | Status: DC | PRN
  Administered 2021-06-30 (×2): 2 via ORAL
  Filled 2021-06-29 (×2): qty 2

## 2021-06-29 MED ORDER — OXYCODONE HCL 5 MG/5ML PO SOLN: 5.0000 mg | Freq: Once | ORAL | Status: DC | PRN

## 2021-06-29 MED ORDER — ALUM & MAG HYDROXIDE-SIMETH 200-200-20 MG/5ML PO SUSP: 30.0000 mL | ORAL | Status: DC | PRN

## 2021-06-29 MED ORDER — METHOCARBAMOL 500 MG IVPB - SIMPLE MED
500.0000 mg | Freq: Four times a day (QID) | INTRAVENOUS | Status: DC | PRN
  Filled 2021-06-29: qty 50

## 2021-06-29 MED ORDER — BISACODYL 5 MG PO TBEC: 5.0000 mg | DELAYED_RELEASE_TABLET | Freq: Every day | ORAL | Status: DC | PRN

## 2021-06-29 SURGICAL SUPPLY — 49 items
BAG COUNTER SPONGE SURGICOUNT (BAG) IMPLANT
BAG SPEC THK2 15X12 ZIP CLS (MISCELLANEOUS) ×1
BAG SPNG CNTER NS LX DISP (BAG)
BAG ZIPLOCK 12X15 (MISCELLANEOUS) ×2 IMPLANT
BLADE SAGITTAL 25.0X1.19X90 (BLADE) ×1 IMPLANT
BLADE SAW SGTL 11.0X1.19X90.0M (BLADE) ×1 IMPLANT
BLADE SAW SGTL 81X20 HD (BLADE) ×1 IMPLANT
BLADE SURG SZ10 CARB STEEL (BLADE) ×4 IMPLANT
BNDG ELASTIC 4X5.8 VLCR STR LF (GAUZE/BANDAGES/DRESSINGS) ×2 IMPLANT
BNDG ELASTIC 6X5.8 VLCR STR LF (GAUZE/BANDAGES/DRESSINGS) ×2 IMPLANT
BOOTIES KNEE HIGH SLOAN (MISCELLANEOUS) ×2 IMPLANT
CEMENT HV SMART SET (Cement) ×1 IMPLANT
CUFF TOURN SGL QUICK 34 (TOURNIQUET CUFF) ×2
CUFF TRNQT CYL 34X4.125X (TOURNIQUET CUFF) ×1 IMPLANT
DECANTER SPIKE VIAL GLASS SM (MISCELLANEOUS) ×1 IMPLANT
DRAPE INCISE IOBAN 66X45 STRL (DRAPES) ×6 IMPLANT
DRAPE ORTHO SPLIT 77X108 STRL (DRAPES)
DRAPE SURG ORHT 6 SPLT 77X108 (DRAPES) IMPLANT
DRAPE U-SHAPE 47X51 STRL (DRAPES) ×2 IMPLANT
DRSG AQUACEL AG ADV 3.5X10 (GAUZE/BANDAGES/DRESSINGS) ×2 IMPLANT
ELECT REM PT RETURN 15FT ADLT (MISCELLANEOUS) ×2 IMPLANT
EVACUATOR 1/8 PVC DRAIN (DRAIN) IMPLANT
GLOVE SRG 8 PF TXTR STRL LF DI (GLOVE) ×2 IMPLANT
GLOVE SURG ENC MOIS LTX SZ8 (GLOVE) ×4 IMPLANT
GLOVE SURG UNDER POLY LF SZ8 (GLOVE) ×4
GOWN STRL REUS W/TWL XL LVL3 (GOWN DISPOSABLE) ×4 IMPLANT
HANDPIECE INTERPULSE COAX TIP (DISPOSABLE) ×2
HOLDER FOLEY CATH W/STRAP (MISCELLANEOUS) ×1 IMPLANT
INSERT TIB SIGMA 5X20 (Knees) ×1 IMPLANT
KIT TURNOVER KIT A (KITS) ×2 IMPLANT
NDL SAFETY ECLIPSE 18X1.5 (NEEDLE) IMPLANT
NEEDLE HYPO 18GX1.5 SHARP (NEEDLE)
NS IRRIG 1000ML POUR BTL (IV SOLUTION) ×2 IMPLANT
PACK TOTAL KNEE CUSTOM (KITS) ×1 IMPLANT
PROTECTOR NERVE ULNAR (MISCELLANEOUS) ×2 IMPLANT
SET HNDPC FAN SPRY TIP SCT (DISPOSABLE) ×1 IMPLANT
SPONGE T-LAP 18X18 ~~LOC~~+RFID (SPONGE) ×2 IMPLANT
STAPLER VISISTAT 35W (STAPLE) IMPLANT
SUT ETHIBOND NAB CT1 #1 30IN (SUTURE) ×4 IMPLANT
SUT VIC AB 0 CT1 36 (SUTURE) ×2 IMPLANT
SUT VIC AB 2-0 CT1 27 (SUTURE) ×2
SUT VIC AB 2-0 CT1 TAPERPNT 27 (SUTURE) ×1 IMPLANT
SUT VICRYL AB 3-0 FS1 BRD 27IN (SUTURE) ×2 IMPLANT
SUT VLOC 180 0 24IN GS25 (SUTURE) ×2 IMPLANT
SYR 3ML LL SCALE MARK (SYRINGE) IMPLANT
TRAY FOLEY MTR SLVR 16FR STAT (SET/KITS/TRAYS/PACK) ×2 IMPLANT
TRAY REVISION SZ 4 (Knees) ×1 IMPLANT
WATER STERILE IRR 1000ML POUR (IV SOLUTION) ×2 IMPLANT
WRAP KNEE MAXI GEL POST OP (GAUZE/BANDAGES/DRESSINGS) ×1 IMPLANT

## 2021-06-29 NOTE — Interval H&P Note (Signed)
History and Physical Interval Note:  06/29/2021 3:38 PM  Cesar Davis  has presented today for surgery, with the diagnosis of PAINFUL RIGHT KNEE REPLACEMENT.  The various methods of treatment have been discussed with the patient and family. After consideration of risks, benefits and other options for treatment, the patient has consented to  Procedure(s): RIGHT TOTAL KNEE REVISION TIBIA (Right) as a surgical intervention.  The patient's history has been reviewed, patient examined, no change in status, stable for surgery.  I have reviewed the patient's chart and labs.  Questions were answered to the patient's satisfaction.     [name]

## 2021-06-29 NOTE — Anesthesia Preprocedure Evaluation (Signed)
Anesthesia Evaluation  Patient identified by MRN, date of birth, ID band Patient awake    Reviewed: Allergy & Precautions, NPO status , Patient's Chart, lab work & pertinent test results  History of Anesthesia Complications Negative for: history of anesthetic complications  Airway Mallampati: II  TM Distance: >3 FB Neck ROM: Full    Dental  (+) Dental Advisory Given   Pulmonary former smoker,    breath sounds clear to auscultation       Cardiovascular hypertension, Pt. on medications and Pt. on home beta blockers (-) angina Rhythm:Regular Rate:Normal     Neuro/Psych negative neurological ROS     GI/Hepatic negative GI ROS, Neg liver ROS,   Endo/Other  negative endocrine ROS  Renal/GU negative Renal ROS     Musculoskeletal  (+) Arthritis , Osteoarthritis,    Abdominal   Peds  Hematology negative hematology ROS (+)   Anesthesia Other Findings   Reproductive/Obstetrics                             Anesthesia Physical Anesthesia Plan  ASA: 2  Anesthesia Plan: Spinal   Post-op Pain Management:  Regional for Post-op pain   Induction:   PONV Risk Score and Plan: 1 and Ondansetron and Dexamethasone  Airway Management Planned: Simple Face Mask and Natural Airway  Additional Equipment: None  Intra-op Plan:   Post-operative Plan:   Informed Consent: I have reviewed the patients History and Physical, chart, labs and discussed the procedure including the risks, benefits and alternatives for the proposed anesthesia with the patient or authorized representative who has indicated his/her understanding and acceptance.     Dental advisory given  Plan Discussed with: Surgeon and CRNA  Anesthesia Plan Comments: (Plan routine monitors, SAB with adductor canal block for post op analgesia)        Anesthesia Quick Evaluation

## 2021-06-29 NOTE — Anesthesia Procedure Notes (Signed)
Spinal  Patient location during procedure: OR Start time: 06/29/2021 4:29 PM End time: 06/29/2021 4:32 PM Reason for block: surgical anesthesia Staffing Performed: resident/CRNA  Resident/CRNA: Lollie Sails, CRNA Preanesthetic Checklist Completed: patient identified, IV checked, site marked, risks and benefits discussed, surgical consent, monitors and equipment checked, pre-op evaluation and timeout performed Spinal Block Patient position: sitting Prep: DuraPrep and site prepped and draped Patient monitoring: heart rate, continuous pulse ox, blood pressure and cardiac monitor Approach: midline Location: L3-4 Injection technique: single-shot Needle Needle type: Sprotte  Needle gauge: 25 G Needle length: 10 cm Assessment Events: CSF return Additional Notes Expiration date on kit noted and within range.  Sterile prep and drape.   Local with Lidocaine 1%.  Good CSF flow noted with no heme or c/o paresthesia.   Patient tolerated well.

## 2021-06-29 NOTE — Op Note (Signed)
PREOP DIAGNOSIS: PAINFUL RIGHT TKR POSTOP DIAGNOSIS: same PROCEDURE: REVISION RIGHT TKR ANESTHESIA: Spinal and MAC ATTENDING SURGEON: Cesar Davis ASSISTANT: Cesar Dolly PA  INDICATIONS FOR PROCEDURE: Cesar Davis is a 65 y.o. male who has struggled for a long time with pain since  right TKR in 2014. The patient has failed many conservative non-operative measures and at this point has pain which limits the ability to sleep and walk.  The patient is offered total knee replacement revision.  Informed operative consent was obtained after discussion of possible risks of anesthesia, infection, neurovascular injury, DVT, and death.  The importance of the post-operative rehabilitation protocol to optimize result was stressed extensively with the patient.  SUMMARY OF FINDINGS AND PROCEDURE:  Cesar Davis was taken to the operative suite where under the above anesthesia a right knee replacement revision was performed.  The tibial component was loose and the bone quality was excellent.  We used the DePuy  system and placed size 4 MBT revision tray tibia and a size 20 mm spacer.  Cesar Dolly PA-C assisted throughout and was invaluable to the completion of the case in that he helped retract and maintain exposure while I placed components.  He also helped close thereby minimizing OR time.  The patient was admitted for appropriate post-op care to include perioperative antibiotics and mechanical and pharmacologic measures for DVT prophylaxis.  DESCRIPTION OF PROCEDURE:  Cesar Davis was taken to the operative suite where the above anesthesia was applied.  The patient was positioned supine and prepped and draped in normal sterile fashion.  An appropriate time out was performed.  After the administration of kefzol pre-op antibiotic the leg was elevated and exsanguinated and a tourniquet inflated.His old incision was made on the anterior knee.  Dissection was carried down to the extensor mechanism.  All appropriate  anti-infective measures were used including the pre-operative antibiotic, betadine impregnated drape, and closed hooded exhaust systems for each member of the surgical team.  A medial parapatellar incision was made in the extensor mechanism and the knee cap flipped and the knee flexed.  Some scar tissue was removed and the tibial component examined closely.  We tapped the component with an osteotome and there seemed to be motion.  I used a small saw and narrow osteotomes and easily removed the tibial baseplate losing very little bone.  A revision cut was made with an extramedulary guide.  The tibia sized to a 4 and the appropriate reaming was performed.  I then did a trial reduction and upsizing to a 20 spacer from the 19 gave [place] good stability. The trial components were removed and the bone were cleaned with pulsatile lavage and then dried thoroughly.  Cement was mixed and was pressurized onto the bone followed by placement of the aforementioned components.  Excess cement was trimmed and pressure was held on the component until the cement had hardened.  The tourniquet was deflated and a small amount of bleeding was controlled with cautery and pressure.  The knee was irrigated thoroughly.  The extensor mechanism was re-approximated with #1 ethibond in interrupted fashion.  The knee was flexed and the repair was solid.  The subcutaneous tissues were re-approximated with #0 and #2-0 vicryl and the skin closed with a subcuticular stitch and steristrips.  A sterile dressing was applied.  Intraoperative fluids, EBL, and tourniquet time can be obtained from anesthesia records.  DISPOSITION:  The patient was taken to recovery room in stable condition and admitted for  appropriate post-op care to include peri-operative antibiotic and DVT prophylaxis with mechanical and pharmacologic measures.  Cesar Davis 06/29/2021, 6:15 PM

## 2021-06-29 NOTE — Anesthesia Procedure Notes (Signed)
Procedure Name: MAC Date/Time: 06/29/2021 4:26 PM Performed by: Lollie Sails, CRNA Pre-anesthesia Checklist: Patient identified, Emergency Drugs available, Suction available, Patient being monitored and Timeout performed Oxygen Delivery Method: Simple face mask Placement Confirmation: positive ETCO2

## 2021-06-29 NOTE — Progress Notes (Signed)
AssistedDr. Kyle Jackson with right, ultrasound guided, adductor canal block. Side rails up, monitors on throughout procedure. See vital signs in flow sheet. Tolerated Procedure well.  

## 2021-06-29 NOTE — Transfer of Care (Signed)
Immediate Anesthesia Transfer of Care Note  Patient: AGUSTUS MANE  Procedure(s) Performed: RIGHT  KNEE REVISION TIBIA (Right: Knee)  Patient Location: PACU  Anesthesia Type:Spinal  Level of Consciousness: drowsy and patient cooperative  Airway & Oxygen Therapy: Patient Spontanous Breathing and Patient connected to face mask oxygen  Post-op Assessment: Report given to RN and Post -op Vital signs reviewed and stable  Post vital signs: Reviewed and stable  Last Vitals:  Vitals Value Taken Time  BP 123/86 06/29/21 1837  Temp    Pulse 61 06/29/21 1840  Resp 15 06/29/21 1840  SpO2 100 % 06/29/21 1840  Vitals shown include unvalidated device data.  Last Pain:  Vitals:   06/29/21 1313  TempSrc:   PainSc: 2       Patients Stated Pain Goal: 1 (06/29/21 1313)  Complications: No notable events documented.

## 2021-06-29 NOTE — Anesthesia Postprocedure Evaluation (Signed)
Anesthesia Post Note  Patient: Cesar Davis  Procedure(s) Performed: RIGHT  KNEE REVISION TIBIA (Right: Knee)     Patient location during evaluation: PACU Anesthesia Type: Spinal Level of consciousness: awake Pain management: pain level controlled Vital Signs Assessment: post-procedure vital signs reviewed and stable Respiratory status: spontaneous breathing Cardiovascular status: stable Postop Assessment: no apparent nausea or vomiting Anesthetic complications: no   No notable events documented.  Last Vitals:  Vitals:   06/29/21 1837 06/29/21 1845  BP: 123/86 116/69  Pulse: 60 (!) 56  Resp: 12 11  Temp: 36.4 C   SpO2: 98% 100%    Last Pain:  Vitals:   06/29/21 1854  TempSrc:   PainSc: 8                  Adden Strout

## 2021-06-29 NOTE — Anesthesia Procedure Notes (Signed)
Anesthesia Regional Block: Adductor canal block   Pre-Anesthetic Checklist: , timeout performed,  Correct Patient, Correct Site, Correct Laterality,  Correct Procedure, Correct Position, site marked,  Risks and benefits discussed,  Surgical consent,  Pre-op evaluation,  At surgeon's request and post-op pain management  Laterality: Right and Lower  Prep: chloraprep       Needles:  Injection technique: Single-shot  Needle Type: Echogenic Needle     Needle Length: 9cm  Needle Gauge: 21     Additional Needles:   Procedures:,,,, ultrasound used (permanent image in chart),,    Narrative:  Start time: 06/29/2021 2:52 PM End time: 06/29/2021 2:58 PM Injection made incrementally with aspirations every 5 mL.  Performed by: Personally  Anesthesiologist: Jairo Ben, MD  Additional Notes: Pt identified in Holding room.  Monitors applied. Working IV access confirmed. Sterile prep R thigh.  #21ga ECHOgenic Arrow block needle into adductor canal with US guidance.  20cc 0.5% Ropivacaine injected incrementally after negative test dose.  Patient asymptomatic, VSS, no heme aspirated, tolerated well.   Sandford Craze, MD

## 2021-06-30 ENCOUNTER — Other Ambulatory Visit (HOSPITAL_COMMUNITY): Payer: Self-pay

## 2021-06-30 MED ORDER — OXYCODONE-ACETAMINOPHEN 5-325 MG PO TABS
1.0000 | ORAL_TABLET | Freq: Four times a day (QID) | ORAL | 0 refills | Status: DC | PRN
  Filled 2021-06-30: qty 40, 5d supply, fill #0

## 2021-06-30 MED ORDER — TIZANIDINE HCL 4 MG PO TABS
4.0000 mg | ORAL_TABLET | Freq: Three times a day (TID) | ORAL | 1 refills | Status: DC | PRN
  Filled 2021-06-30: qty 40, 14d supply, fill #0

## 2021-06-30 NOTE — Discharge Summary (Signed)
Patient ID: Cesar Davis MRN: [id number] DOB/AGE: Jul 13, 1956 65 y.o.  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 male who was admitted 06/29/2021 for operative treatment ofHistory of revision of total replacement of left knee joint. Patient has severe unremitting pain that affects sleep, daily activities, and work/hobbies. After pre-op clearance the patient was taken to the operating room on 06/29/2021 and underwent  Procedure(s): RIGHT  KNEE REVISION TIBIA.    Patient was given perioperative antibiotics:  Anti-infectives (From admission, onward)    Start     Dose/Rate Route Frequency Ordered Stop   06/30/21 0600  ceFAZolin (ANCEF) IVPB 2g/100 mL premix        2 g 200 mL/hr over 30 Minutes Intravenous On call to O.R. 06/29/21 1247 06/29/21 1650   06/29/21 2230  ceFAZolin (ANCEF) IVPB 2g/100 mL premix        2 g 200 mL/hr over 30 Minutes Intravenous Every 6 hours 06/29/21 2000 06/30/21 0538        Patient was given sequential compression devices, early ambulation, and chemoprophylaxis to prevent DVT.  Patient benefited maximally from hospital stay and there were no complications.    Recent vital signs: Patient Vitals for the past 24 hrs:  BP Temp Temp src Pulse Resp SpO2 Height Weight  06/30/21 0532 129/67 98.2 F (36.8 C) Oral 64 18 99 % -- --  06/30/21 0145 130/76 98.2 F (36.8 C) Oral 70 18 97 % -- --  06/29/21 2250 (!) 142/78 98.3 F (36.8 C) Oral 65 18 97 % -- --  06/29/21 2141 140/85 98  F (36.7 C) Oral 75 18 99 % -- --  06/29/21 2051 (!) 142/83 97.7 F (36.5 C) Oral 88 18 100 % -- --  06/29/21 1948 136/83 97.8 F (36.6 C) Oral 60 18 100 % -- --  06/29/21 1930 130/85 98.1 F (36.7 C) -- 61 12 100 % -- --  06/29/21 1915 125/84 -- -- 60 13 100 % -- --  06/29/21 1900 122/82 -- -- 63 16 99 % -- --  06/29/21 1845 116/69 -- -- (!) 56 11 100 % -- --  06/29/21 1837 123/86 97.6 F (36.4 C) -- 60 12 98 % -- --  06/29/21 1536 135/87 -- -- 67 11 100 % -- --  06/29/21 1531 (!) 137/92 -- -- 70 14 100 % -- --  06/29/21 1526 (!) 136/91 -- -- 69 16 100 % -- --  06/29/21 1521 (!) 144/93 -- -- 77 15 100 % -- --  06/29/21 1516 (!) 145/92 -- -- 70 (!) 9 100 % -- --  06/29/21 1511 (!) 140/94 -- -- 73 11 100 % -- --  06/29/21 1506 (!) 142/98 -- -- 76 (!) 8 100 % -- --  06/29/21 1501 (!) 145/97 -- -- 72 10 100 % -- --  06/29/21 1456 (!) 148/96 -- -- 64 14 100 % -- --  06/29/21 1451 (!) 151/96 -- -- 65 12 100 % -- --  06/29/21 1314 -- -- -- -- -- --  (1.854 m) 103 kg  06/29/21 1256 118/68 (!) 97.5 F (36.4 C) Oral 73 18 99 % -- --     Recent laboratory studies: No results for input(s): WBC, HGB, HCT, PLT, NA, K, CL, CO2, BUN, CREATININE, GLUCOSE, INR, CALCIUM in the last 72 hours.  Invalid input(s): PT, 2   Discharge Medications:   Allergies as of 06/30/2021   No Known Allergies      Medication List     STOP taking these medications    aspirin 325 MG EC tablet       TAKE these medications    DULoxetine 30 MG capsule Commonly known as: CYMBALTA Take 60 mg by mouth daily.   Ferrous Sulfate 28 MG Tabs Take 28 mg by mouth daily.   lisinopril-hydrochlorothiazide 20-12.5 MG tablet Commonly known as: ZESTORETIC Take 1 tablet by mouth daily.   metoprolol succinate 50 MG 24 hr tablet Commonly known as: TOPROL-XL Take 50 mg by mouth daily. Take with or immediately following a meal.   multivitamin capsule Take 3 capsules by mouth daily.   Omega 3 500 500 MG  Caps Take 500 mg by mouth daily.   oxyCODONE-acetaminophen 5-325 MG tablet Commonly known as: PERCOCET/ROXICET Take 1-2 tablets by mouth every 6 (six) hours as needed for severe pain (post op pain). What changed: reasons to take this   tadalafil 5 MG tablet Commonly known as: CIALIS Take 5 mg by mouth daily.   tiZANidine 4 MG tablet Commonly known as: ZANAFLEX Take 1 tablet (4 mg total) by mouth every 8 (eight) hours as needed for muscle spasms (muscle pain). What changed:  medication strength reasons to take this   traMADol 50 MG tablet Commonly known as: ULTRAM Take 50 mg by mouth every 4 (four) hours as needed for pain.   vitamin B-12 1000 MCG tablet Commonly known as: CYANOCOBALAMIN Take 1,000 mcg by mouth daily.   Vitamin D3 10 MCG (400 UNIT) Caps Take 400 Units by mouth daily.               Durable Medical Equipment  (From admission, onward)           Start     Ordered   06/29/21 2001  DME Walker rolling  Once       Question:  Patient needs a walker to treat with the following condition  Answer:  Primary osteoarthritis of left knee   06/29/21 2000   06/29/21 2001  DME 3 n 1  Once        06/29/21 2000   06/29/21 2001  DME Bedside commode  Once       Question:  Patient needs a bedside commode to treat with the following condition  Answer:  Primary osteoarthritis of left knee   06/29/21 2000            Diagnostic Studies: No results found.  Disposition: Discharge disposition: 01-Home or Self Care       Discharge Instructions     Call MD / Call 911   Complete by: As directed    If you experience chest pain or shortness of breath, CALL 911 and be transported to the hospital emergency room.  If you develope a fever above 101 F, pus (white drainage) or increased drainage or redness at the wound, or calf pain, call your surgeon's office.   Constipation Prevention   Complete by: As directed    Drink plenty of  fluids.  Prune juice may be  helpful.  You may use a stool softener, such as Colace (over the counter) 100 mg twice a day.  Use MiraLax (over the counter) for constipation as needed.   Diet - low sodium heart healthy   Complete by: As directed    Discharge instructions   Complete by: As directed    INSTRUCTIONS AFTER JOINT REPLACEMENT   Remove items at home which could result in a fall. This includes throw rugs or furniture in walking pathways ICE to the affected joint every three hours while awake for 30 minutes at a time, for at least the first 3-5 days, and then as needed for pain and swelling.  Continue to use ice for pain and swelling. You may notice swelling that will progress down to the foot and ankle.  This is normal after surgery.  Elevate your leg when you are not up walking on it.   Continue to use the breathing machine you got in the hospital (incentive spirometer) which will help keep your temperature down.  It is common for your temperature to cycle up and down following surgery, especially at night when you are not up moving around and exerting yourself.  The breathing machine keeps your lungs expanded and your temperature down.   DIET:  As you were doing prior to hospitalization, we recommend a well-balanced diet.  DRESSING / WOUND CARE / SHOWERING  You may shower 3 days after surgery, but keep the wounds dry during showering.  You may use an occlusive plastic wrap (Press'n Seal for example), NO SOAKING/SUBMERGING IN THE BATHTUB.  If the bandage gets wet, change with a clean dry gauze.  If the incision gets wet, pat the wound dry with a clean towel.  ACTIVITY  Increase activity slowly as tolerated, but follow the weight bearing instructions below.   No driving for 6 weeks or until further direction given by your physician.  You cannot drive while taking narcotics.  No lifting or carrying greater than 10 lbs. until further directed by your surgeon. Avoid periods of inactivity such as sitting longer than an  hour when not asleep. This helps prevent blood clots.  You may return to work once you are authorized by your doctor.     WEIGHT BEARING   Weight bearing as tolerated with assist device (walker, cane, etc) as directed, use it as long as suggested by your surgeon or therapist, typically at least 4-6 weeks.   EXERCISES  Results after joint replacement surgery are often greatly improved when you follow the exercise, range of motion and muscle strengthening exercises prescribed by your doctor. Safety measures are also important to protect the joint from further injury. Any time any of these exercises cause you to have increased pain or swelling, decrease what you are doing until you are comfortable again and then slowly increase them. If you have problems or questions, call your caregiver or physical therapist for advice.   Rehabilitation is important following a joint replacement. After just a few days of immobilization, the muscles of the leg can become weakened and shrink (atrophy).  These exercises are designed to build up the tone and strength of the thigh and leg muscles and to improve motion. Often times heat used for twenty to thirty minutes before working out will loosen up your tissues and help with improving the range of motion but do not use heat for the first two weeks following surgery (sometimes heat can increase post-operative swelling).  These exercises can be done on a training (exercise) mat, on the floor, on a table or on a bed. Use whatever works the best and is most comfortable for you.    Use music or television while you are exercising so that the exercises are a pleasant break in your day. This will make your life better with the exercises acting as a break in your routine that you can look forward to.   Perform all exercises about fifteen times, three times per day or as directed.  You should exercise both the operative leg and the other leg as well.  Exercises include:    Quad Sets - Tighten up the muscle on the front of the thigh (Quad) and hold for 5-10 seconds.   Straight Leg Raises - With your knee straight (if you were given a brace, keep it on), lift the leg to 60 degrees, hold for 3 seconds, and slowly lower the leg.  Perform this exercise against resistance later as your leg gets stronger.  Leg Slides: Lying on your back, slowly slide your foot toward your buttocks, bending your knee up off the floor (only go as far as is comfortable). Then slowly slide your foot back down until your leg is flat on the floor again.  Angel Wings: Lying on your back spread your legs to the side as far apart as you can without causing discomfort.  Hamstring Strength:  Lying on your back, push your heel against the floor with your leg straight by tightening up the muscles of your buttocks.  Repeat, but this time bend your knee to a comfortable angle, and push your heel against the floor.  You may put a pillow under the heel to make it more comfortable if necessary.   A rehabilitation program following joint replacement surgery can speed recovery and prevent re-injury in the future due to weakened muscles. Contact your doctor or a physical therapist for more information on knee rehabilitation.    CONSTIPATION  Constipation is defined medically as fewer than three stools per week and severe constipation as less than one stool per week.  Even if you have a regular bowel pattern at home, your normal regimen is likely to be disrupted due to multiple reasons following surgery.  Combination of anesthesia, postoperative narcotics, change in appetite and fluid intake all can affect your bowels.   YOU MUST use at least one of the following options; they are listed in order of increasing strength to get the job done.  They are all available over the counter, and you may need to use some, POSSIBLY even all of these options:    Drink plenty of fluids (prune juice may be helpful) and high  fiber foods Colace 100 mg by mouth twice a day  Senokot for constipation as directed and as needed Dulcolax (bisacodyl), take with full glass of water  Miralax (polyethylene glycol) once or twice a day as needed.  If you have tried all these things and are unable to have a bowel movement in the first 3-4 days after surgery call either your surgeon or your primary doctor.    If you experience loose stools or diarrhea, hold the medications until you stool forms back up.  If your symptoms do not get better within 1 week or if they get worse, check with your doctor.  If you experience "the worst abdominal pain ever" or develop nausea or vomiting, please contact the office immediately for further recommendations for treatment.  ITCHING:  If you experience itching with your medications, try taking only a single pain pill, or even half a pain pill at a time.  You can also use Benadryl over the counter for itching or also to help with sleep.   TED HOSE STOCKINGS:  Use stockings on both legs until for at least 2 weeks or as directed by physician office. They may be removed at night for sleeping.  MEDICATIONS:  See your medication summary on the "After Visit Summary" that nursing will review with you.  You may have some home medications which will be placed on hold until you complete the course of blood thinner medication.  It is important for you to complete the blood thinner medication as prescribed.  PRECAUTIONS:  If you experience chest pain or shortness of breath - call 911 immediately for transfer to the hospital emergency department.   If you develop a fever greater that 101 F, purulent drainage from wound, increased redness or drainage from wound, foul odor from the wound/dressing, or calf pain - CONTACT YOUR SURGEON.                                                   FOLLOW-UP APPOINTMENTS:  If you do not already have a post-op appointment, please call the office for an appointment to be seen by  your surgeon.  Guidelines for how soon to be seen are listed in your "After Visit Summary", but are typically between 1-4 weeks after surgery.  OTHER INSTRUCTIONS:   Knee Replacement:  Do not place pillow under knee, focus on keeping the knee straight while resting. CPM instructions: 0-90 degrees, 2 hours in the morning, 2 hours in the afternoon, and 2 hours in the evening. Place foam block, curve side up under heel at all times except when in CPM or when walking.  DO NOT modify, tear, cut, or change the foam block in any way.  POST-OPERATIVE OPIOID TAPER INSTRUCTIONS: It is important to wean off of your opioid medication as soon as possible. If you do not need pain medication after your surgery it is ok to stop day one. Opioids include: Codeine, Hydrocodone(Norco, Vicodin), Oxycodone(Percocet, oxycontin) and hydromorphone amongst others.  Long term and even short term use of opiods can cause: Increased pain response Dependence Constipation Depression Respiratory depression And more.  Withdrawal symptoms can include Flu like symptoms Nausea, vomiting And more Techniques to manage these symptoms Hydrate well Eat regular healthy meals Stay active Use relaxation techniques(deep breathing, meditating, yoga) Do Not substitute Alcohol to help with tapering If you have been on opioids for less than two weeks and do not have pain than it is ok to stop all together.  Plan to wean off of opioids This plan should start within one week post op of your joint replacement. Maintain the same interval or time between taking each dose and first decrease the dose.  Cut the total daily intake of opioids by one tablet each day Next start to increase the time between doses. The last dose that should be eliminated is the evening dose.     MAKE SURE YOU:  Understand these instructions.  Get help right away if you are not doing well or get worse.    Thank you for letting us be a part of your medical  care team.  It is  a privilege we respect greatly.  We hope these instructions will help you stay on track for a fast and full recovery!   Increase activity slowly as tolerated   Complete by: As directed    Post-operative opioid taper instructions:   Complete by: As directed    POST-OPERATIVE OPIOID TAPER INSTRUCTIONS: It is important to wean off of your opioid medication as soon as possible. If you do not need pain medication after your surgery it is ok to stop day one. Opioids include: Codeine, Hydrocodone(Norco, Vicodin), Oxycodone(Percocet, oxycontin) and hydromorphone amongst others.  Long term and even short term use of opiods can cause: Increased pain response Dependence Constipation Depression Respiratory depression And more.  Withdrawal symptoms can include Flu like symptoms Nausea, vomiting And more Techniques to manage these symptoms Hydrate well Eat regular healthy meals Stay active Use relaxation techniques(deep breathing, meditating, yoga) Do Not substitute Alcohol to help with tapering If you have been on opioids for less than two weeks and do not have pain than it is ok to stop all together.  Plan to wean off of opioids This plan should start within one week post op of your joint replacement. Maintain the same interval or time between taking each dose and first decrease the dose.  Cut the total daily intake of opioids by one tablet each day Next start to increase the time between doses. The last dose that should be eliminated is the evening dose.           Follow-up Information     Marcene Corning, MD. Schedule an appointment as soon as possible for a visit in 2 week(s).   Specialty: Orthopedic Surgery Contact information: [address] [place] [address] [phone]                  Signed: Ginger Organ Elie Leppo 06/30/2021, 8:17 AM

## 2021-06-30 NOTE — Progress Notes (Signed)
Subjective: 1 Day Post-Op Procedure(s) (LRB): RIGHT  KNEE REVISION TIBIA (Right)  Patient is doping very well. He is hoping to go home this morning.  Activity level:  wbat Diet tolerance:  ok Voiding:  ok Patient reports pain as mild.    Objective: Vital signs in last 24 hours: Temp:  [97.5 F (36.4 C)-98.3 F (36.8 C)] 98.2 F (36.8 C) (09/28 0532) Pulse Rate:  [56-88] 64 (09/28 0532) Resp:  [8-18] 18 (09/28 0532) BP: (116-151)/(67-98) 129/67 (09/28 0532) SpO2:  [97 %-100 %] 99 % (09/28 0532) Weight:  [001 kg] 103 kg (09/27 1314)  Labs: No results for input(s): HGB in the last 72 hours. No results for input(s): WBC, RBC, HCT, PLT in the last 72 hours. No results for input(s): NA, K, CL, CO2, BUN, CREATININE, GLUCOSE, CALCIUM in the last 72 hours. No results for input(s): LABPT, INR in the last 72 hours.  Physical Exam:  Neurologically intact ABD soft Neurovascular intact Sensation intact distally Intact pulses distally Dorsiflexion/Plantar flexion intact Incision: dressing C/D/I and no drainage No cellulitis present Compartment soft  Assessment/Plan:  1 Day Post-Op Procedure(s) (LRB): RIGHT  KNEE REVISION TIBIA (Right) Advance diet Up with therapy D/C IV fluids Discharge home with home health today after cleared by PT. Continue on 81mg  asa BID for dvt prevention. Follow up in office 2 weeks post op.  Jameila Keeny 06/30/2021, 8:12 AM

## 2021-06-30 NOTE — Progress Notes (Signed)
Discharge packet provided to pt. Instructions given to pt and family member at bedside. All questions answered. Pt walking around room independently, ready to leave. IV removed. Pt left via wheelchair with staff.

## 2021-06-30 NOTE — Evaluation (Signed)
Physical Therapy Evaluation Patient Details Name: Cesar Davis MRN: [id number] DOB: 10-03-1956 Today's Date: 06/30/2021  History of Present Illness  65 y.o.male admitted for L TKA revision. PMH includes L TKA 2014, R TSA 2014, HTN.  Clinical Impression  Pt is independent with mobility. He ambulated 350' with RW without loss of balance. He demonstrates good understanding of HEP. He declined stair training, he stated he recalls the technique for this. He is ready to DC home from a PT standpoint.        Recommendations for follow up therapy are one component of a multi-disciplinary discharge planning process, led by the attending physician.  Recommendations may be updated based on patient status, additional functional criteria and insurance authorization.  Follow Up Recommendations Follow surgeon's recommendation for DC plan and follow-up therapies;Outpatient PT    Equipment Recommendations  None recommended by PT    Recommendations for Other Services       Precautions / Restrictions Precautions Precautions: Knee Precaution Booklet Issued: Yes (comment) Precaution Comments: reviewed no pillow under knee Restrictions Weight Bearing Restrictions: No RLE Weight Bearing: Weight bearing as tolerated      Mobility  Bed Mobility Overal bed mobility: Independent                  Transfers Overall transfer level: Independent                  Ambulation/Gait Ambulation/Gait assistance: Modified independent (Device/Increase time) Gait Distance (Feet): 350 Feet Assistive device: Rolling walker (2 wheeled) Gait Pattern/deviations: WFL(Within Functional Limits) Gait velocity: WNL   General Gait Details: steady, no loss of balance  Stairs            Wheelchair Mobility    Modified Rankin (Stroke Patients Only)       Balance Overall balance assessment: Modified Independent                                           Pertinent  Vitals/Pain Pain Assessment: 0-10 Pain Score: 3  Pain Location: R knee Pain Descriptors / Indicators: Sore Pain Intervention(s): Limited activity within patient's tolerance;Monitored during session;Premedicated before session;Ice applied    Home Living Family/patient expects to be discharged to:: Private residence Living Arrangements: Spouse/significant other Available Help at Discharge: Family;Available 24 hours/day Type of Home: House Home Access: Stairs to enter   [employer] of Steps: 3 Home Layout: One level Home Equipment: Walker - 2 wheels;Cane - single point      Prior Function Level of Independence: Independent               Hand Dominance        Extremity/Trunk Assessment   Upper Extremity Assessment Upper Extremity Assessment: Overall WFL for tasks assessed    Lower Extremity Assessment Lower Extremity Assessment: RLE deficits/detail RLE Deficits / Details: SLR at least 3/5, knee ext 4/5, AAROM knee 0-120* RLE Sensation: WNL RLE Coordination: WNL    Cervical / Trunk Assessment Cervical / Trunk Assessment: Normal  Communication   Communication: No difficulties  Cognition Arousal/Alertness: Awake/alert Behavior During Therapy: WFL for tasks assessed/performed Overall Cognitive Status: Within Functional Limits for tasks assessed                                        General  Comments      Exercises Total Joint Exercises Ankle Circles/Pumps: AROM;Both;10 reps;Supine Quad Sets: AROM;Right;5 reps;Supine Towel Squeeze: AROM;Both;10 reps;Supine Short Arc Quad: AROM;Right;10 reps;Supine Heel Slides: AAROM;Right;10 reps;Supine Hip ABduction/ADduction: AROM;Right;5 reps;Supine Straight Leg Raises: AROM;Right;5 reps;Supine Long Arc Quad: AROM;Right;10 reps;Seated Knee Flexion: AAROM;Right;10 reps;Seated Goniometric ROM: 0-120* AAROM R knee   Assessment/Plan    PT Assessment Patent does not need any further PT services   PT Problem List         PT Treatment Interventions      PT Goals (Current goals can be found in the Care Plan section)  Acute Rehab PT Goals Patient Stated Goal: to walk farther PT Goal Formulation: All assessment and education complete, DC therapy    Frequency     Barriers to discharge        Co-evaluation               AM-PAC PT "6 Clicks" Mobility  Outcome Measure Help needed turning from your back to your side while in a flat bed without using bedrails?: None Help needed moving from lying on your back to sitting on the side of a flat bed without using bedrails?: None Help needed moving to and from a bed to a chair (including a wheelchair)?: None Help needed standing up from a chair using your arms (e.g., wheelchair or bedside chair)?: None Help needed to walk in hospital room?: None Help needed climbing 3-5 steps with a railing? : None 6 Click Score: 24    End of Session   Activity Tolerance: Patient tolerated treatment well Patient left: in chair;with call bell/phone within reach Nurse Communication: Mobility status      Time: 3151-7616 PT Time Calculation (min) (ACUTE ONLY): 18 min   Charges:   PT Evaluation $PT Eval Moderate Complexity: 1 Mod         Tamala Ser PT 06/30/2021  Acute Rehabilitation Services Pager [phone] Office [phone]

## 2021-07-01 DIAGNOSIS — T8484XD Pain due to internal orthopedic prosthetic devices, implants and grafts, subsequent encounter: Secondary | ICD-10-CM | POA: Diagnosis not present

## 2021-07-01 DIAGNOSIS — R7303 Prediabetes: Secondary | ICD-10-CM | POA: Diagnosis not present

## 2021-07-01 DIAGNOSIS — Z96653 Presence of artificial knee joint, bilateral: Secondary | ICD-10-CM | POA: Diagnosis not present

## 2021-07-01 DIAGNOSIS — I10 Essential (primary) hypertension: Secondary | ICD-10-CM | POA: Diagnosis not present

## 2021-07-01 DIAGNOSIS — Z9181 History of falling: Secondary | ICD-10-CM | POA: Diagnosis not present

## 2021-07-05 ENCOUNTER — Encounter (HOSPITAL_COMMUNITY): Payer: Self-pay | Admitting: Orthopaedic Surgery

## 2021-07-07 DIAGNOSIS — R7303 Prediabetes: Secondary | ICD-10-CM | POA: Diagnosis not present

## 2021-07-07 DIAGNOSIS — Z96653 Presence of artificial knee joint, bilateral: Secondary | ICD-10-CM | POA: Diagnosis not present

## 2021-07-07 DIAGNOSIS — T8484XD Pain due to internal orthopedic prosthetic devices, implants and grafts, subsequent encounter: Secondary | ICD-10-CM | POA: Diagnosis not present

## 2021-07-07 DIAGNOSIS — I10 Essential (primary) hypertension: Secondary | ICD-10-CM | POA: Diagnosis not present

## 2021-07-07 DIAGNOSIS — Z9181 History of falling: Secondary | ICD-10-CM | POA: Diagnosis not present

## 2021-07-09 DIAGNOSIS — Z471 Aftercare following joint replacement surgery: Secondary | ICD-10-CM | POA: Diagnosis not present

## 2021-07-09 DIAGNOSIS — Z9181 History of falling: Secondary | ICD-10-CM | POA: Diagnosis not present

## 2021-07-09 DIAGNOSIS — Z96653 Presence of artificial knee joint, bilateral: Secondary | ICD-10-CM | POA: Diagnosis not present

## 2021-07-09 DIAGNOSIS — T8484XD Pain due to internal orthopedic prosthetic devices, implants and grafts, subsequent encounter: Secondary | ICD-10-CM | POA: Diagnosis not present

## 2021-07-09 DIAGNOSIS — R7303 Prediabetes: Secondary | ICD-10-CM | POA: Diagnosis not present

## 2021-07-09 DIAGNOSIS — I10 Essential (primary) hypertension: Secondary | ICD-10-CM | POA: Diagnosis not present

## 2021-07-09 DIAGNOSIS — Z96651 Presence of right artificial knee joint: Secondary | ICD-10-CM | POA: Diagnosis not present

## 2021-07-13 DIAGNOSIS — T8484XD Pain due to internal orthopedic prosthetic devices, implants and grafts, subsequent encounter: Secondary | ICD-10-CM | POA: Diagnosis not present

## 2021-07-13 DIAGNOSIS — R7303 Prediabetes: Secondary | ICD-10-CM | POA: Diagnosis not present

## 2021-07-13 DIAGNOSIS — Z96653 Presence of artificial knee joint, bilateral: Secondary | ICD-10-CM | POA: Diagnosis not present

## 2021-07-13 DIAGNOSIS — I10 Essential (primary) hypertension: Secondary | ICD-10-CM | POA: Diagnosis not present

## 2021-07-13 DIAGNOSIS — Z9181 History of falling: Secondary | ICD-10-CM | POA: Diagnosis not present

## 2021-07-14 DIAGNOSIS — I10 Essential (primary) hypertension: Secondary | ICD-10-CM | POA: Diagnosis not present

## 2021-07-14 DIAGNOSIS — R7303 Prediabetes: Secondary | ICD-10-CM | POA: Diagnosis not present

## 2021-07-14 DIAGNOSIS — Z96653 Presence of artificial knee joint, bilateral: Secondary | ICD-10-CM | POA: Diagnosis not present

## 2021-07-14 DIAGNOSIS — T8484XD Pain due to internal orthopedic prosthetic devices, implants and grafts, subsequent encounter: Secondary | ICD-10-CM | POA: Diagnosis not present

## 2021-07-14 DIAGNOSIS — Z9181 History of falling: Secondary | ICD-10-CM | POA: Diagnosis not present

## 2021-07-21 DIAGNOSIS — R7301 Impaired fasting glucose: Secondary | ICD-10-CM | POA: Diagnosis not present

## 2021-07-21 DIAGNOSIS — M791 Myalgia, unspecified site: Secondary | ICD-10-CM | POA: Diagnosis not present

## 2021-07-21 DIAGNOSIS — E7800 Pure hypercholesterolemia, unspecified: Secondary | ICD-10-CM | POA: Diagnosis not present

## 2021-07-21 DIAGNOSIS — Z79899 Other long term (current) drug therapy: Secondary | ICD-10-CM | POA: Diagnosis not present

## 2021-07-21 DIAGNOSIS — R5383 Other fatigue: Secondary | ICD-10-CM | POA: Diagnosis not present

## 2021-07-21 DIAGNOSIS — I10 Essential (primary) hypertension: Secondary | ICD-10-CM | POA: Diagnosis not present

## 2021-07-23 DIAGNOSIS — Z23 Encounter for immunization: Secondary | ICD-10-CM | POA: Diagnosis not present

## 2021-08-16 DIAGNOSIS — M25661 Stiffness of right knee, not elsewhere classified: Secondary | ICD-10-CM | POA: Diagnosis not present

## 2021-08-16 DIAGNOSIS — M25611 Stiffness of right shoulder, not elsewhere classified: Secondary | ICD-10-CM | POA: Diagnosis not present

## 2021-08-16 DIAGNOSIS — M6281 Muscle weakness (generalized): Secondary | ICD-10-CM | POA: Diagnosis not present

## 2021-08-16 DIAGNOSIS — M25511 Pain in right shoulder: Secondary | ICD-10-CM | POA: Diagnosis not present

## 2021-08-16 DIAGNOSIS — Z96651 Presence of right artificial knee joint: Secondary | ICD-10-CM | POA: Diagnosis not present

## 2021-08-31 DIAGNOSIS — M25611 Stiffness of right shoulder, not elsewhere classified: Secondary | ICD-10-CM | POA: Diagnosis not present

## 2021-08-31 DIAGNOSIS — M25511 Pain in right shoulder: Secondary | ICD-10-CM | POA: Diagnosis not present

## 2021-08-31 DIAGNOSIS — M6281 Muscle weakness (generalized): Secondary | ICD-10-CM | POA: Diagnosis not present

## 2021-08-31 DIAGNOSIS — Z96651 Presence of right artificial knee joint: Secondary | ICD-10-CM | POA: Diagnosis not present

## 2021-08-31 DIAGNOSIS — M25661 Stiffness of right knee, not elsewhere classified: Secondary | ICD-10-CM | POA: Diagnosis not present

## 2021-09-13 DIAGNOSIS — M25511 Pain in right shoulder: Secondary | ICD-10-CM | POA: Diagnosis not present

## 2021-09-13 DIAGNOSIS — M25611 Stiffness of right shoulder, not elsewhere classified: Secondary | ICD-10-CM | POA: Diagnosis not present

## 2021-09-13 DIAGNOSIS — Z96651 Presence of right artificial knee joint: Secondary | ICD-10-CM | POA: Diagnosis not present

## 2021-09-13 DIAGNOSIS — M6281 Muscle weakness (generalized): Secondary | ICD-10-CM | POA: Diagnosis not present

## 2021-09-13 DIAGNOSIS — M25661 Stiffness of right knee, not elsewhere classified: Secondary | ICD-10-CM | POA: Diagnosis not present

## 2021-09-22 DIAGNOSIS — M25552 Pain in left hip: Secondary | ICD-10-CM | POA: Diagnosis not present

## 2021-09-22 DIAGNOSIS — Z9889 Other specified postprocedural states: Secondary | ICD-10-CM | POA: Diagnosis not present

## 2021-09-29 DIAGNOSIS — M25511 Pain in right shoulder: Secondary | ICD-10-CM | POA: Diagnosis not present

## 2022-01-11 DIAGNOSIS — M150 Primary generalized (osteo)arthritis: Secondary | ICD-10-CM | POA: Diagnosis not present

## 2022-01-11 DIAGNOSIS — R7301 Impaired fasting glucose: Secondary | ICD-10-CM | POA: Diagnosis not present

## 2022-01-11 DIAGNOSIS — G8929 Other chronic pain: Secondary | ICD-10-CM | POA: Diagnosis not present

## 2022-01-11 DIAGNOSIS — I10 Essential (primary) hypertension: Secondary | ICD-10-CM | POA: Diagnosis not present

## 2022-06-14 IMAGING — DX DG CHEST 2V
2 series · 2 of 2 positions shown · non-contrast
Comparison: Radiograph 06/11/2020

CLINICAL DATA: Preop for right knee revision.

EXAM:
CHEST - 2 VIEW

[chest pa]
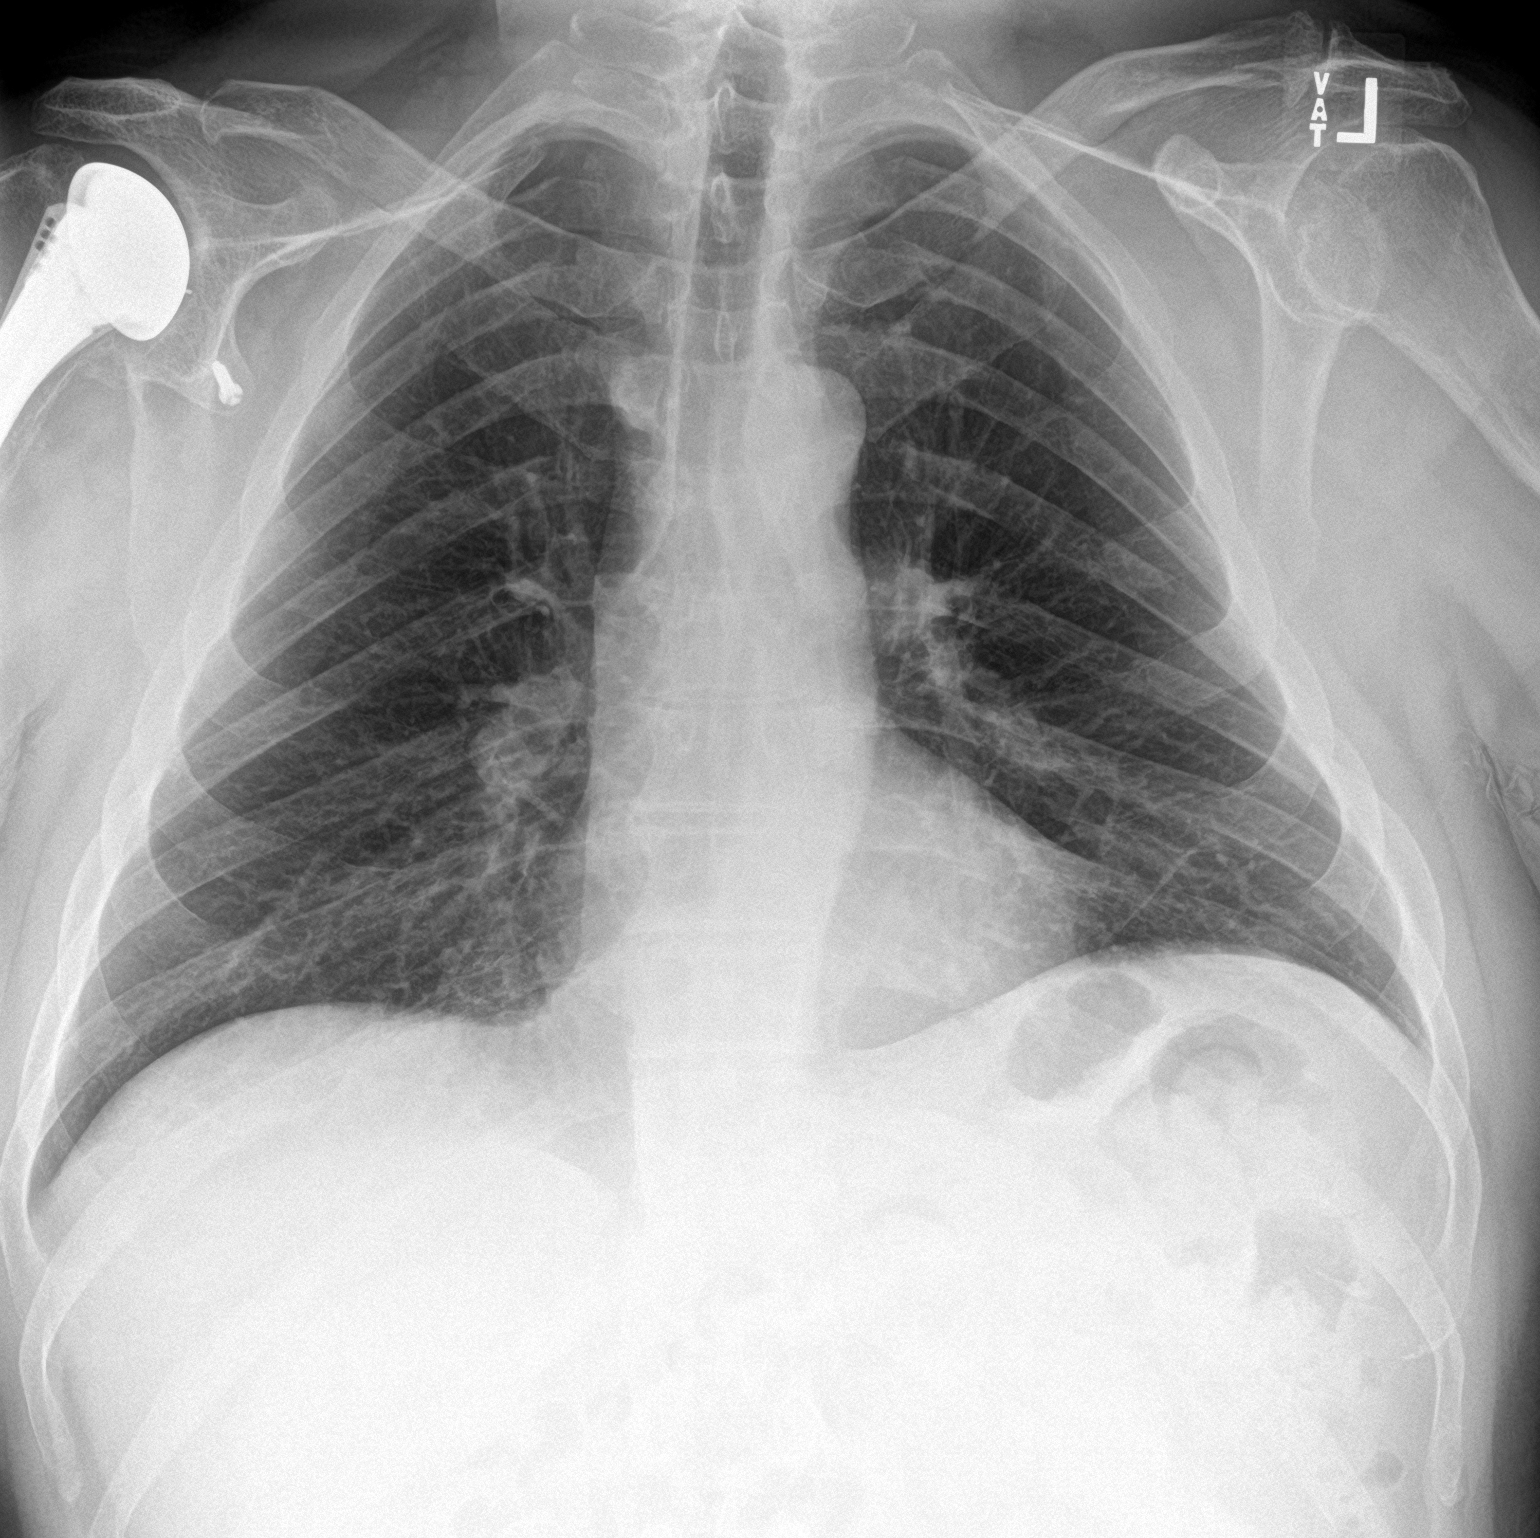

[chest lat]
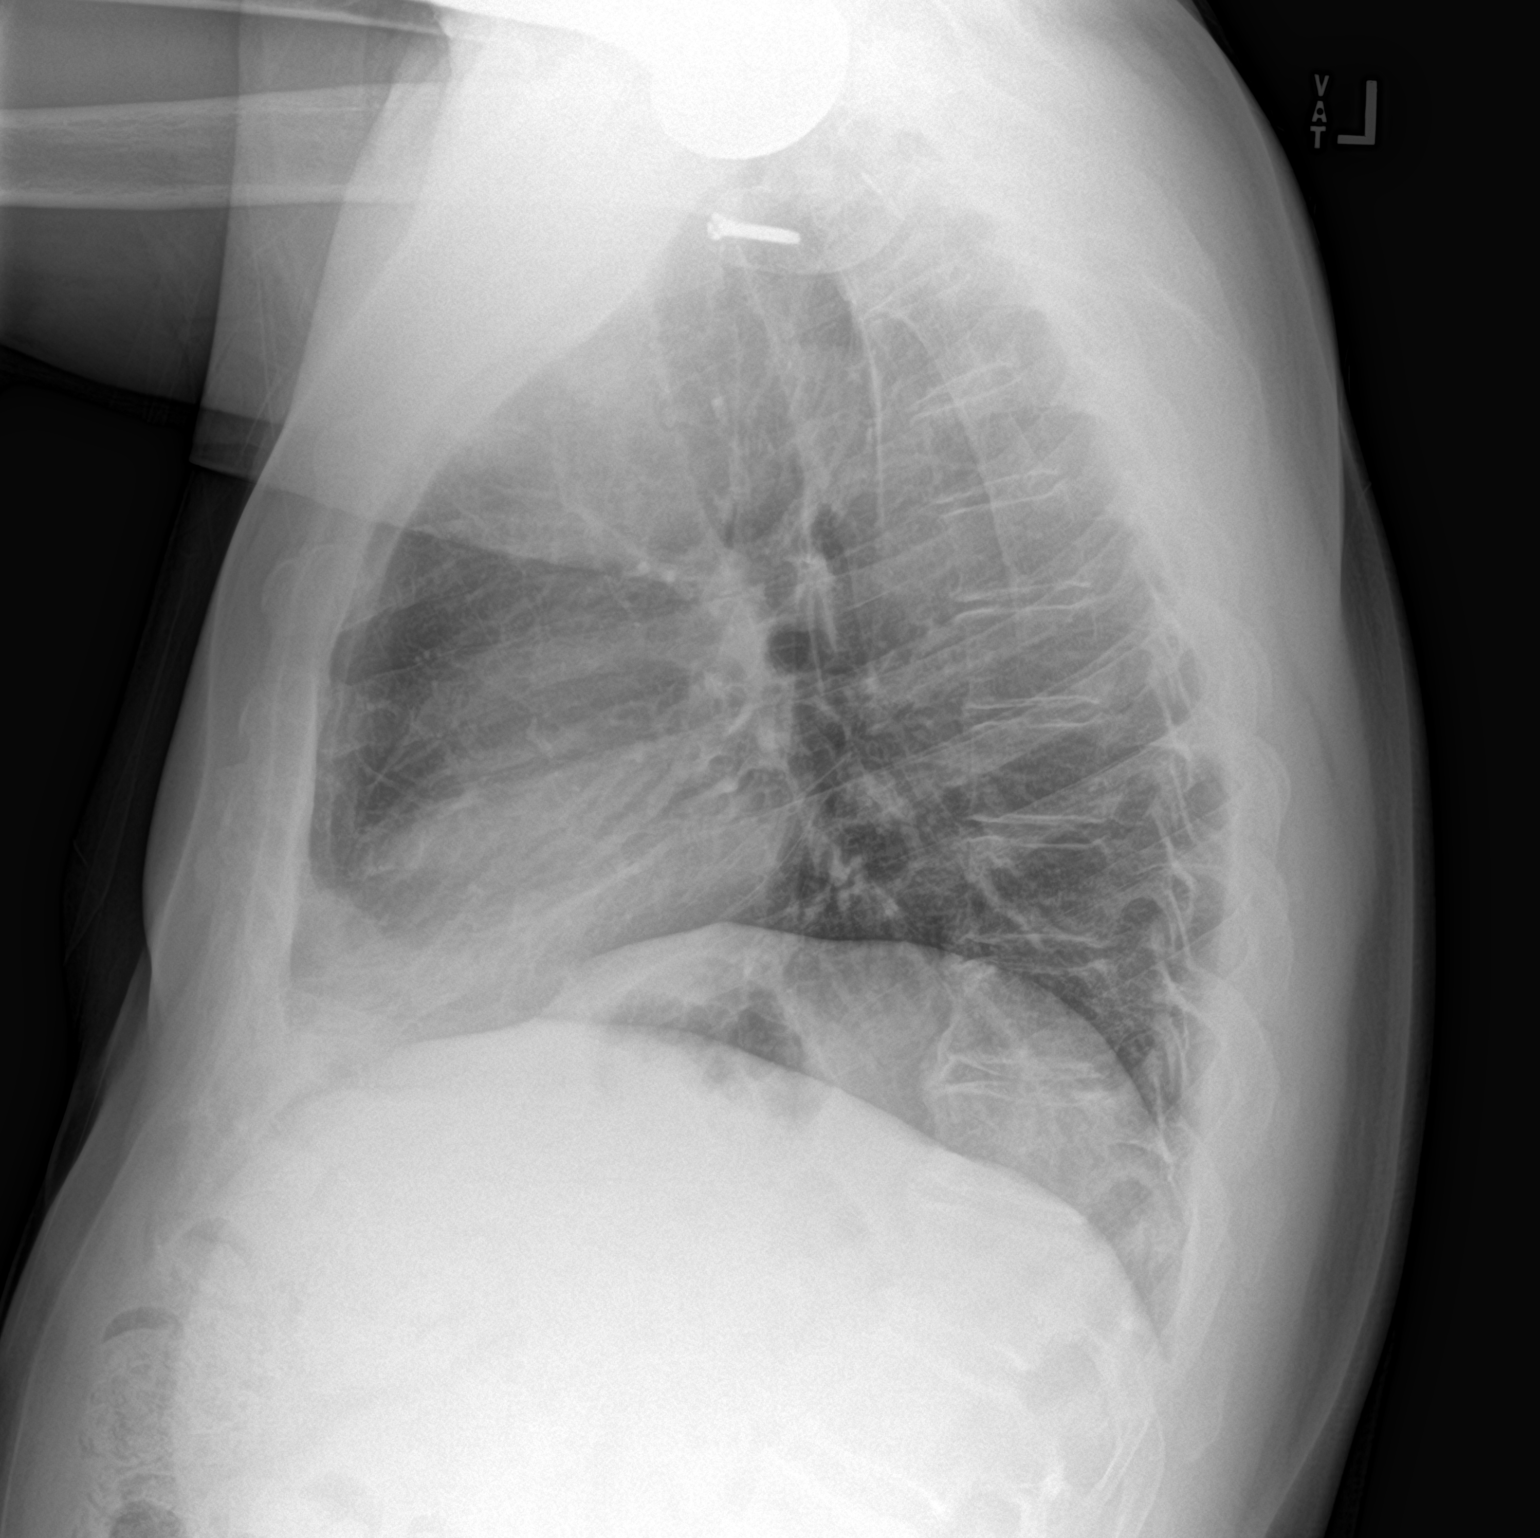

[2 of 2 positions shown; findings below may reference images not displayed]

FINDINGS: The cardiomediastinal contours are normal. The lungs are clear.
Minimal chronic elevation of left hemidiaphragm with adjacent
subsegmental scar. Pulmonary vasculature is normal. No
consolidation, pleural effusion, or pneumothorax. Right humeral head
arthroplasty. No acute osseous abnormalities are seen.
IMPRESSION: No acute chest findings.

## 2022-06-27 DIAGNOSIS — Z96651 Presence of right artificial knee joint: Secondary | ICD-10-CM | POA: Diagnosis not present

## 2022-06-27 DIAGNOSIS — M7062 Trochanteric bursitis, left hip: Secondary | ICD-10-CM | POA: Diagnosis not present

## 2022-08-11 ENCOUNTER — Ambulatory Visit: Payer: BC Managed Care – PPO | Admitting: Medical

## 2022-08-11 ENCOUNTER — Encounter: Payer: Self-pay | Admitting: Medical

## 2022-08-11 VITALS — BP 139/80 | HR 82 | Temp 98.8°F | Resp 18 | Ht 73.0 in | Wt 242.0 lb

## 2022-08-11 DIAGNOSIS — R739 Hyperglycemia, unspecified: Secondary | ICD-10-CM | POA: Diagnosis not present

## 2022-08-11 DIAGNOSIS — Z125 Encounter for screening for malignant neoplasm of prostate: Secondary | ICD-10-CM

## 2022-08-11 DIAGNOSIS — Z23 Encounter for immunization: Secondary | ICD-10-CM

## 2022-08-11 DIAGNOSIS — Z1211 Encounter for screening for malignant neoplasm of colon: Secondary | ICD-10-CM

## 2022-08-11 DIAGNOSIS — R972 Elevated prostate specific antigen [PSA]: Secondary | ICD-10-CM

## 2022-08-11 DIAGNOSIS — Z0000 Encounter for general adult medical examination without abnormal findings: Secondary | ICD-10-CM

## 2022-08-11 NOTE — Addendum Note (Signed)
Addended by: Maximino Sarin on: 08/11/2022 11:46 AM   Modules accepted: Orders

## 2022-08-11 NOTE — Patient Instructions (Addendum)
For you wellness exam today I have ordered cbc, cmp and lipid panel.  Flu vaccine today. Pt will decide on covid vaccine. Will see if your records are here and update other vaccines later such as tdap and shingrix.   Recommend exercise and healthy diet.  We will let you know lab results as they come in.  Follow up date appointment will be determined after lab review.     Check bp daily over next week and update me in one week on readings.  ED and bph- continue cialis.  For joint pain- continue cymbalta.  For elevates sugar will add A1c.

## 2022-08-11 NOTE — Addendum Note (Signed)
Addended by: Maximino Sarin on: 08/11/2022 11:15 AM   Modules accepted: Orders

## 2022-08-11 NOTE — Progress Notes (Signed)
Subjective:    Patient ID: Cesar Davis, male    DOB: 1956/09/14, 66 y.o.   MRN: [id number]  HPI  Pt in for first time.  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  Pt not exercising regularly. Admits poor diet. Eats out every day. Caffeine. 2 cups of coffee a day. Nonsmoker. Very rare alcohol.  Married. Does admit dipping skole. States sees dentist regularly and no gum issues.  Htn- bp borderline. Zestoretic and metroprol daily. On regimen for years.  ED and bph-  pt uses cialis.  On cymbalta- states uses for joint pain. 60 mg daily.  Elevated sugar in past. Told prediabetic.    Review of Systems  Constitutional:  Negative for chills, diaphoresis and fatigue.  HENT:  Negative for congestion, ear discharge, ear pain and facial swelling.   Respiratory:  Negative for cough, chest tightness, shortness of breath and wheezing.   Cardiovascular:  Negative for chest pain and palpitations.  Gastrointestinal:  Negative for abdominal distention, abdominal pain and anal bleeding.  Genitourinary:  Negative for dysuria and frequency.  Musculoskeletal:  Negative for back pain, myalgias and neck pain.  Skin:  Negative for rash.  Neurological:  Negative for dizziness, syncope, weakness, numbness and headaches.  Hematological:  Negative for adenopathy. Does not bruise/bleed easily.  Psychiatric/Behavioral:  Negative for behavioral problems, decreased concentration and dysphoric mood. The patient is not nervous/anxious and is not hyperactive.      Past Medical History:  Diagnosis Date   Arthritis    "right shoulder; knees; left ankle" (01/09/2013)   History of shingles 01/2011   Hypertension    Pre-diabetes    Pt denies     Social History   Socioeconomic History   Marital status: Married    Spouse name: Not on file   Number of children: Not on file   Years of education: Not on file   Highest education level: Not on file  Occupational History   Not on file  Tobacco Use   Smoking status:  Former    Packs/day: 0.50    Years: 15.00    Total pack years: 7.50    Types: Cigarettes    Quit date: 09/12/1999    Years since quitting: 22.9   Smokeless tobacco: Current    Types: Snuff  Vaping Use   Vaping Use: Never used  Substance and Sexual Activity   Alcohol use: Not Currently   Drug use: No   Sexual activity: Yes  Other Topics Concern   Not on file  Social History Narrative   Not on file   Social Determinants of Health   Financial Resource Strain: Not on file  Food Insecurity: Not on file  Transportation Needs: Not on file  Physical Activity: Not on file  Stress: Not on file  Social Connections: Not on file  Intimate Partner Violence: Not on file    Past Surgical History:  Procedure Laterality Date   ANKLE FRACTURE SURGERY Left 2004?   ARTHROSCOPIC REPAIR PCL Right    FINGER SURGERY Bilateral 1985   "fusion pinky;    INGUINAL HERNIA REPAIR Right 2006   JOINT REPLACEMENT Right    shoulder   KNEE ARTHROSCOPY Right 2010; 12/2012   ORIF HUMERUS FRACTURE Right 03/11/2021   Procedure: OPEN REDUCTION INTERNAL FIXATION RIGHT PERIPROSTHETIC HUMERUS FRACTURE;  Surgeon: Jones Broom, MD;  Location: [facility];  Service: Orthopedics;  Laterality: Right;   TOTAL KNEE ARTHROPLASTY Right 01/09/2013   TOTAL KNEE ARTHROPLASTY Right 01/09/2013   Procedure: TOTAL KNEE  ARTHROPLASTY;  Surgeon: Nestor Lewandowsky, MD;  Location: [facility];  Service: Orthopedics;  Laterality: Right;   TOTAL KNEE REVISION Right 06/29/2021   Procedure: RIGHT  KNEE REVISION TIBIA;  Surgeon: Marcene Corning, MD;  Location: [facility];  Service: Orthopedics;  Laterality: Right;   TOTAL SHOULDER ARTHROPLASTY Right 09/19/2013   DR CHANDLER    TOTAL SHOULDER ARTHROPLASTY Right 09/19/2013   Procedure: TOTAL SHOULDER ARTHROPLASTY;  Surgeon: Mable Paris, MD;  Location: [facility] OR;  Service: Orthopedics;  Laterality: Right;  REVISION HEMIARTHROPLASTY TO TOTAL SHOULDER   TOTAL SHOULDER REPLACEMENT Right ~ 2006    No  family history on file.  No Known Allergies  Current Outpatient Medications on File Prior to Visit  Medication Sig Dispense Refill   Cholecalciferol (VITAMIN D3) 10 MCG (400 UNIT) CAPS Take 400 Units by mouth daily.     DULoxetine (CYMBALTA) 30 MG capsule Take 60 mg by mouth daily.     Ferrous Sulfate 28 MG TABS Take 28 mg by mouth daily.     lisinopril-hydrochlorothiazide (ZESTORETIC) 20-12.5 MG tablet Take 1 tablet by mouth daily.     metoprolol succinate (TOPROL-XL) 50 MG 24 hr tablet Take 50 mg by mouth daily. Take with or immediately following a meal.     Multiple Vitamin (MULTIVITAMIN) capsule Take 3 capsules by mouth daily.     Omega-3 Fatty Acids (OMEGA 3 500) 500 MG CAPS Take 500 mg by mouth daily.     oxyCODONE-acetaminophen (PERCOCET/ROXICET) 5-325 MG tablet Take 1-2 tablets by mouth every 6 (six) hours as needed for severe pain (post op pain). (Patient not taking: Reported on 08/11/2022) 40 tablet 0   tadalafil (CIALIS) 5 MG tablet Take 5 mg by mouth daily.     tiZANidine (ZANAFLEX) 4 MG tablet Take 1 tablet by mouth every 8 hours as needed for muscle spasms (muscle pain). (Patient not taking: Reported on 08/11/2022) 40 tablet 1   traMADol (ULTRAM) 50 MG tablet Take 50 mg by mouth every 4 (four) hours as needed for pain. (Patient not taking: Reported on 08/11/2022)     vitamin B-12 (CYANOCOBALAMIN) 1000 MCG tablet Take 1,000 mcg by mouth daily.     No current facility-administered medications on file prior to visit.    BP 139/80 Comment: large cuff.  Pulse 82   Temp 98.8 F (37.1 C) (Oral)   Resp 18   Ht 6\' 1"  (1.854 m)   Wt 242 lb (109.8 kg)   SpO2 97%   BMI 31.93 kg/m         Objective:   Physical Exam  General Mental Status- Alert. General Appearance- Not in acute distress.   Skin General: Color- Normal Color. Moisture- Normal Moisture.  Neck Carotid Arteries- Normal color. Moisture- Normal Moisture. No carotid bruits. No JVD.  Chest and Lung  Exam Auscultation: Breath Sounds:-Normal.  Cardiovascular Auscultation:Rythm- Regular. Murmurs & Other Heart Sounds:Auscultation of the heart reveals- No Murmurs.  Abdomen Inspection:-Inspeection Normal. Palpation/Percussion:Note:No mass. Palpation and Percussion of the abdomen reveal- Non Tender, Non Distended + BS, no rebound or guarding.   Neurologic Cranial Nerve exam:- CN III-XII intact(No nystagmus), symmetric smile. Strength:- 5/5 equal and symmetric strength both upper and lower extremities.       Assessment & Plan:   For you wellness exam today I have ordered cbc, cmp and lipid panel.  Flu vaccine today. Pt will decide on covid vaccine. Will see if your records are here and update other vaccines later such as tdap and shingrix.  Recommend exercise and healthy diet.  We will let you know lab results as they come in.  Follow up date appointment will be determined after lab review.     Check bp daily over next week and update me in one week on readings.  ED and bph- continue cialis.  For joint pain- continue cymbalta.  For elevates sugar will add A1c.   Esperanza Richters, PA-C

## 2022-08-12 LAB — COMPREHENSIVE METABOLIC PANEL
AG Ratio: 2.1 (calc) (ref 1.0–2.5)
ALT: 16 U/L (ref 9–46)
AST: 21 U/L (ref 10–35)
Albumin: 4.6 g/dL (ref 3.6–5.1)
Alkaline phosphatase (APISO): 61 U/L (ref 35–144)
BUN: 16 mg/dL (ref 7–25)
CO2: 25 mmol/L (ref 20–32)
Calcium: 9.5 mg/dL (ref 8.6–10.3)
Chloride: 102 mmol/L (ref 98–110)
Creat: 0.86 mg/dL (ref 0.70–1.35)
Globulin: 2.2 g/dL (calc) (ref 1.9–3.7)
Glucose, Bld: 95 mg/dL (ref 65–139)
Potassium: 4.3 mmol/L (ref 3.5–5.3)
Sodium: 138 mmol/L (ref 135–146)
Total Bilirubin: 0.5 mg/dL (ref 0.2–1.2)
Total Protein: 6.8 g/dL (ref 6.1–8.1)

## 2022-08-12 LAB — LIPID PANEL
Cholesterol: 199 mg/dL (ref <200)
HDL: 49 mg/dL (ref >=40)
LDL Cholesterol (Calc): 127 mg/dL (calc) — ABNORMAL HIGH
Non-HDL Cholesterol (Calc): 150 mg/dL (calc) — ABNORMAL HIGH (ref <130)
Total CHOL/HDL Ratio: 4.1 (calc) (ref <5.0)
Triglycerides: 119 mg/dL (ref <150)

## 2022-08-12 LAB — CBC WITH DIFFERENTIAL/PLATELET
Absolute Monocytes: 533 cells/uL (ref 200–950)
Basophils Absolute: 68 cells/uL (ref 0–200)
Basophils Relative: 1.1 %
Eosinophils Absolute: 112 cells/uL (ref 15–500)
Eosinophils Relative: 1.8 %
HCT: 46.4 % (ref 38.5–50.0)
Hemoglobin: 16 g/dL (ref 13.2–17.1)
Lymphs Abs: 1525 cells/uL (ref 850–3900)
MCH: 29 pg (ref 27.0–33.0)
MCHC: 34.5 g/dL (ref 32.0–36.0)
MCV: 84.2 fL (ref 80.0–100.0)
MPV: 10.1 fL (ref 7.5–12.5)
Monocytes Relative: 8.6 %
Neutro Abs: 3962 cells/uL (ref 1500–7800)
Neutrophils Relative %: 63.9 %
Platelets: 282 10*3/uL (ref 140–400)
RBC: 5.51 10*6/uL (ref 4.20–5.80)
RDW: 12.3 % (ref 11.0–15.0)
Total Lymphocyte: 24.6 %
WBC: 6.2 10*3/uL (ref 3.8–10.8)

## 2022-08-12 LAB — HEMOGLOBIN A1C
Hgb A1c MFr Bld: 5.9 % of total Hgb — ABNORMAL HIGH (ref <5.7)
Mean Plasma Glucose: 123 mg/dL
eAG (mmol/L): 6.8 mmol/L

## 2022-08-12 LAB — PSA: PSA: 5.31 ng/mL — ABNORMAL HIGH (ref <=4.00)

## 2022-08-13 NOTE — Addendum Note (Signed)
Addended by: Gwenevere Abbot on: 08/13/2022 09:30 AM   Modules accepted: Orders

## 2022-08-30 ENCOUNTER — Encounter: Payer: Self-pay | Admitting: Urology

## 2022-09-13 ENCOUNTER — Encounter: Payer: Self-pay | Admitting: Urology

## 2022-09-13 ENCOUNTER — Ambulatory Visit: Payer: BC Managed Care – PPO | Admitting: Medical

## 2022-09-13 VITALS — BP 142/84 | HR 69 | Temp 98.0°F | Resp 18 | Ht 73.0 in | Wt 238.0 lb

## 2022-09-13 DIAGNOSIS — G8929 Other chronic pain: Secondary | ICD-10-CM

## 2022-09-13 DIAGNOSIS — R739 Hyperglycemia, unspecified: Secondary | ICD-10-CM

## 2022-09-13 DIAGNOSIS — E785 Hyperlipidemia, unspecified: Secondary | ICD-10-CM

## 2022-09-13 DIAGNOSIS — I10 Essential (primary) hypertension: Secondary | ICD-10-CM

## 2022-09-13 DIAGNOSIS — Z79899 Other long term (current) drug therapy: Secondary | ICD-10-CM

## 2022-09-13 DIAGNOSIS — R972 Elevated prostate specific antigen [PSA]: Secondary | ICD-10-CM

## 2022-09-13 MED ORDER — HYDROCHLOROTHIAZIDE 12.5 MG PO CAPS: 12.5000 mg | ORAL_CAPSULE | Freq: Every day | ORAL | 11 refills | Status: DC

## 2022-09-13 MED ORDER — ROSUVASTATIN CALCIUM 10 MG PO TABS: 10.0000 mg | ORAL_TABLET | Freq: Every day | ORAL | 11 refills | Status: AC

## 2022-09-13 MED ORDER — TRAMADOL HCL 50 MG PO TABS: ORAL_TABLET | ORAL | 0 refills | Status: DC

## 2022-09-13 MED ORDER — LISINOPRIL 40 MG PO TABS: 40.0000 mg | ORAL_TABLET | Freq: Every day | ORAL | 11 refills | Status: DC

## 2022-09-13 NOTE — Progress Notes (Signed)
Subjective:    Patient ID: LOEL BETANCUR, male    DOB: 09/21/1956, 66 y.o.   MRN: [id number]  HPI Pt bp still at home in 140/80-90 range. Pt has been checking daily. He has not been exercising.  On zestoretic and toprol xl daily.  Mild lipid elevation.  The 10-year ASCVD risk score (Arnett DK, et al., 2019) is: 14.1%   Values used to calculate the score:     Age: 34 years     Sex: Male     Is Non-Hispanic African American: No     Diabetic: No     Tobacco smoker: No     Systolic Blood Pressure: 142 mmHg     Is BP treated: No     HDL Cholesterol: 49 mg/dL     Total Cholesterol: 199 mg/dL   Mom and grandom both had triple bypass. Surgery in 50 and 64 yo.  Elevated psa of 5.31. Pt has urologist. He will follow up with his urologist.  Pt was given tramadol for rt shoulder, rt knee pain and ankle pain. Former pcp was giving him tramadol. Pt takes tramadol 3 tab a day. Worse area of pain is rt knee. Without tramadol pain level is 7-8/10   Review of Systems  Constitutional:  Negative for chills, fatigue and fever.  HENT:  Negative for congestion, drooling and ear discharge.   Respiratory:  Negative for cough, chest tightness, shortness of breath and wheezing.   Cardiovascular:  Negative for chest pain and palpitations.  Gastrointestinal:  Negative for abdominal pain and blood in stool.  Genitourinary:  Negative for dysuria and flank pain.  Musculoskeletal:  Negative for back pain and gait problem.  Neurological:  Negative for dizziness, speech difficulty, weakness, numbness and headaches.  Hematological:  Negative for adenopathy. Does not bruise/bleed easily.  Psychiatric/Behavioral:  Negative for behavioral problems and confusion.     Past Medical History:  Diagnosis Date   Arthritis    "right shoulder; knees; left ankle" (01/09/2013)   History of shingles 01/2011   Hypertension    Pre-diabetes    Pt denies     Social History   Socioeconomic History   Marital status:  Married    Spouse name: Not on file   Number of children: Not on file   Years of education: Not on file   Highest education level: Not on file  Occupational History   Not on file  Tobacco Use   Smoking status: Former    Packs/day: 0.50    Years: 15.00    Total pack years: 7.50    Types: Cigarettes    Quit date: 09/12/1999    Years since quitting: 23.0   Smokeless tobacco: Current    Types: Snuff  Vaping Use   Vaping Use: Never used  Substance and Sexual Activity   Alcohol use: Not Currently   Drug use: No   Sexual activity: Yes  Other Topics Concern   Not on file  Social History Narrative   Not on file   Social Determinants of Health   Financial Resource Strain: Not on file  Food Insecurity: Not on file  Transportation Needs: Not on file  Physical Activity: Not on file  Stress: Not on file  Social Connections: Not on file  Intimate Partner Violence: Not on file    Past Surgical History:  Procedure Laterality Date   ANKLE FRACTURE SURGERY Left 2004?   ARTHROSCOPIC REPAIR PCL Right    FINGER SURGERY Bilateral 1985   "  fusion pinky;    INGUINAL HERNIA REPAIR Right 2006   JOINT REPLACEMENT Right    shoulder   KNEE ARTHROSCOPY Right 2010; 12/2012   ORIF HUMERUS FRACTURE Right 03/11/2021   Procedure: OPEN REDUCTION INTERNAL FIXATION RIGHT PERIPROSTHETIC HUMERUS FRACTURE;  Surgeon: Jones Broom, MD;  Location: [facility];  Service: Orthopedics;  Laterality: Right;   TOTAL KNEE ARTHROPLASTY Right 01/09/2013   TOTAL KNEE ARTHROPLASTY Right 01/09/2013   Procedure: TOTAL KNEE ARTHROPLASTY;  Surgeon: Nestor Lewandowsky, MD;  Location: [facility] OR;  Service: Orthopedics;  Laterality: Right;   TOTAL KNEE REVISION Right 06/29/2021   Procedure: RIGHT  KNEE REVISION TIBIA;  Surgeon: Marcene Corning, MD;  Location: [facility];  Service: Orthopedics;  Laterality: Right;   TOTAL SHOULDER ARTHROPLASTY Right 09/19/2013   DR CHANDLER    TOTAL SHOULDER ARTHROPLASTY Right 09/19/2013   Procedure: TOTAL  SHOULDER ARTHROPLASTY;  Surgeon: Mable Paris, MD;  Location: [facility] OR;  Service: Orthopedics;  Laterality: Right;  REVISION HEMIARTHROPLASTY TO TOTAL SHOULDER   TOTAL SHOULDER REPLACEMENT Right ~ 2006    No family history on file.  No Known Allergies  Current Outpatient Medications on File Prior to Visit  Medication Sig Dispense Refill   Cholecalciferol (VITAMIN D3) 10 MCG (400 UNIT) CAPS Take 400 Units by mouth daily.     DULoxetine (CYMBALTA) 30 MG capsule Take 60 mg by mouth daily.     Ferrous Sulfate 28 MG TABS Take 28 mg by mouth daily.     lisinopril-hydrochlorothiazide (ZESTORETIC) 20-12.5 MG tablet Take 1 tablet by mouth daily.     metoprolol succinate (TOPROL-XL) 50 MG 24 hr tablet Take 50 mg by mouth daily. Take with or immediately following a meal.     Multiple Vitamin (MULTIVITAMIN) capsule Take 3 capsules by mouth daily.     Omega-3 Fatty Acids (OMEGA 3 500) 500 MG CAPS Take 500 mg by mouth daily.     oxyCODONE-acetaminophen (PERCOCET/ROXICET) 5-325 MG tablet Take 1-2 tablets by mouth every 6 (six) hours as needed for severe pain (post op pain). 40 tablet 0   tadalafil (CIALIS) 5 MG tablet Take 5 mg by mouth daily.     tiZANidine (ZANAFLEX) 4 MG tablet Take 1 tablet by mouth every 8 hours as needed for muscle spasms (muscle pain). 40 tablet 1   traMADol (ULTRAM) 50 MG tablet Take 50 mg by mouth every 4 (four) hours as needed for pain.     vitamin B-12 (CYANOCOBALAMIN) 1000 MCG tablet Take 1,000 mcg by mouth daily.     No current facility-administered medications on file prior to visit.    BP (!) 142/84   Pulse 69   Temp 98 F (36.7 C)   Resp 18   Ht 6\' 1"  (1.854 m)   Wt 238 lb (108 kg)   SpO2 99%   BMI 31.40 kg/m        Objective:   Physical Exam   General Mental Status- Alert. General Appearance- Not in acute distress.   Skin General: Color- Normal Color. Moisture- Normal Moisture.  Neck Carotid Arteries- Normal color. Moisture- Normal  Moisture. No carotid bruits. No JVD.  Chest and Lung Exam Auscultation: Breath Sounds:-Normal.  Cardiovascular Auscultation:Rythm- Regular. Murmurs & Other Heart Sounds:Auscultation of the heart reveals- No Murmurs.  Abdomen Inspection:-Inspeection Normal. Palpation/Percussion:Note:No mass. Palpation and Percussion of the abdomen reveal- Non Tender, Non Distended + BS, no rebound or guarding.   Neurologic Cranial Nerve exam:- CN III-XII intact(No nystagmus), symmetric smile. Strength:- 5/5 equal and symmetric  strength both upper and lower extremities.      Assessment & Plan:   Patient Instructions  Htn- borderline controlled at home. Continue toprol xl current dose. Will increase your lisinopril to 40 mg daily and continue hctz 12.5 mg daily. Stop zestoretic(lisinopril/hctz) tab.  For high cholesterol and risk core of 14.1% recommend low cholesterol diet and low dose crestor. Rx advisement given.  For elevated psa of 5.31 follow up with your urologist.  Chronic shoulder, rt knee and ankle pain. Rx tramadol 50 mg #90 1 tab  every 8 hours prn pain. Have your sign contract and give uds.  Follow up 6 months or sooner if needed.   Esperanza Richters, PA-C    Time spent with patient today was  45 minutes which consisted of chart review, discussing diagnosis, work up, treatment, explaining controlled med contract, uds, answering questions  and documentation.

## 2022-09-13 NOTE — Patient Instructions (Addendum)
Htn- borderline controlled at home. Continue toprol xl current dose. Will increase your lisinopril to 40 mg daily and continue hctz 12.5 mg daily. Stop zestoretic(lisinopril/hctz) tab.  For high cholesterol and risk core of 14.1% recommend low cholesterol diet and low dose crestor. Rx advisement given.  For elevated psa of 5.31 follow up with your urologist.  Chronic shoulder, rt knee and ankle pain. Rx tramadol 50 mg #90 1 tab  every 8 hours prn pain. Have your sign contract and give uds.  Follow up 6 months or sooner if needed.

## 2022-09-16 LAB — DRUG MONITORING PANEL 376104, URINE
Amphetamines: NEGATIVE ng/mL (ref <500)
Barbiturates: NEGATIVE ng/mL (ref <300)
Benzodiazepines: NEGATIVE ng/mL (ref <100)
Cocaine Metabolite: NEGATIVE ng/mL (ref <150)
Desmethyltramadol: 5790 ng/mL — ABNORMAL HIGH (ref <100)
Opiates: NEGATIVE ng/mL (ref <100)
Oxycodone: NEGATIVE ng/mL (ref <100)
Tramadol: 6995 ng/mL — ABNORMAL HIGH (ref <100)

## 2022-09-16 LAB — DM TEMPLATE

## 2022-09-23 DIAGNOSIS — N138 Other obstructive and reflux uropathy: Secondary | ICD-10-CM | POA: Diagnosis not present

## 2022-09-23 DIAGNOSIS — N401 Enlarged prostate with lower urinary tract symptoms: Secondary | ICD-10-CM | POA: Diagnosis not present

## 2022-09-23 DIAGNOSIS — R310 Gross hematuria: Secondary | ICD-10-CM | POA: Diagnosis not present

## 2022-09-23 DIAGNOSIS — R972 Elevated prostate specific antigen [PSA]: Secondary | ICD-10-CM | POA: Diagnosis not present

## 2022-10-11 ENCOUNTER — Other Ambulatory Visit: Payer: Self-pay | Admitting: Medical

## 2022-10-11 NOTE — Telephone Encounter (Signed)
Requesting: tramadol 50mg    Contract: 09/13/22 UDS: 09/13/22 Last Visit: 09/13/22 Next Visit: None  Last Refill: 09/13/22 #90 and 0RF   Please Advise

## 2022-10-12 ENCOUNTER — Other Ambulatory Visit: Payer: Self-pay | Admitting: Medical

## 2022-10-12 ENCOUNTER — Encounter: Payer: Self-pay | Admitting: Medical

## 2022-10-12 NOTE — Telephone Encounter (Signed)
Requesting: tramadol Contract: 09/13/22 UDS:09/13/22 Last Visit:09/13/22 Next Visit:n/a Last Refill:09/13/22  Please Advise

## 2022-10-12 NOTE — Telephone Encounter (Signed)
Requesting: tramadol Contract: 09/13/22 UDS:09/13/22 Last Visit:09/13/22 Next Visit:n/a Last Refill:09/13/22  Please Advise  

## 2022-10-13 MED ORDER — TRAMADOL HCL 50 MG PO TABS: ORAL_TABLET | ORAL | 3 refills | Status: DC

## 2022-12-26 ENCOUNTER — Other Ambulatory Visit: Payer: Self-pay | Admitting: Medical

## 2023-02-16 ENCOUNTER — Telehealth: Payer: Self-pay | Admitting: Medical

## 2023-02-16 NOTE — Telephone Encounter (Signed)
Pt stated he is not coming back , wasn't pleased & has found a new PCP . Will remove Ramon Dredge as PCP

## 2023-02-16 NOTE — Telephone Encounter (Signed)
On review noted have only seen pt once and somehow did not adivse follow up. His insurannce sent a note to me thinking he is on too many meds/ polypharmacy. Ask him to schedule follow up to see how he is and review meds.

## 2023-04-10 ENCOUNTER — Other Ambulatory Visit: Payer: Self-pay | Admitting: Medical

## 2023-07-05 ENCOUNTER — Other Ambulatory Visit: Payer: Self-pay | Admitting: Medical

## 2023-10-17 ENCOUNTER — Other Ambulatory Visit: Payer: Self-pay | Admitting: Orthopaedic Surgery

## 2023-10-17 NOTE — Patient Instructions (Signed)
 DUE TO COVID-19 ONLY TWO VISITORS  (aged 66 and older)  ARE ALLOWED TO COME WITH YOU AND STAY IN THE WAITING ROOM ONLY DURING PRE OP AND PROCEDURE.   **NO VISITORS ARE ALLOWED IN THE SHORT STAY AREA OR RECOVERY ROOM!!**  IF YOU WILL BE ADMITTED INTO THE HOSPITAL YOU ARE ALLOWED ONLY FOUR SUPPORT PEOPLE DURING VISITATION HOURS ONLY (7 AM -8PM)   The support person(s) must pass our screening, gel in and out, and wear a mask at all times, including in the patient's room. Patients must also wear a mask when staff or their support person are in the room. Visitors GUEST BADGE MUST BE WORN VISIBLY  One adult visitor may remain with you overnight and MUST be in the room by 8 P.M.     Your procedure is scheduled on: 10/24/23   Report to [facility] Main Entrance    Report to admitting at : 12:30 PM   Call this number if you have problems the morning of surgery [phone]   Do not eat food :After Midnight.   After Midnight you may have the following liquids until : 11:45 AM DAY OF SURGERY  Water  Black Coffee (sugar ok, NO MILK/CREAM OR CREAMERS)  Tea (sugar ok, NO MILK/CREAM OR CREAMERS) regular and decaf                             Plain Jell-O (NO RED)                                           Fruit ices (not with fruit pulp, NO RED)                                     Popsicles (NO RED)                                                                  Juice: apple, WHITE grape, WHITE cranberry Sports drinks like Gatorade (NO RED)   The day of surgery:  Drink ONE (1) Pre-Surgery Clear Ensure at : 11:45 AM the morning of surgery. Drink in one sitting. Do not sip.  This drink was given to you during your hospital  pre-op appointment visit. Nothing else to drink after completing the  Pre-Surgery Clear Ensure or G2.          If you have questions, please contact your surgeon's office.  FOLLOW ANY ADDITIONAL PRE OP INSTRUCTIONS YOU RECEIVED FROM YOUR SURGEON'S OFFICE!!!   Oral  Hygiene is also important to reduce your risk of infection.                                    Remember - BRUSH YOUR TEETH THE MORNING OF SURGERY WITH YOUR REGULAR TOOTHPASTE  DENTURES WILL BE REMOVED PRIOR TO SURGERY PLEASE DO NOT APPLY "Poly grip" OR ADHESIVES!!!   Do NOT smoke after Midnight   Take these medicines the morning of surgery  with A SIP OF WATER : duloxetine ,metoprolol .                              You may not have any metal on your body including hair pins, jewelry, and body piercing             Do not wear lotions, powders, perfumes/cologne, or deodorant              Men may shave face and neck.   Do not bring valuables to the hospital. [place] IS NOT             RESPONSIBLE   FOR VALUABLES.   Contacts, glasses, or bridgework may not be worn into surgery.   Bring small overnight bag day of surgery.   DO NOT BRING YOUR HOME MEDICATIONS TO THE HOSPITAL. PHARMACY WILL DISPENSE MEDICATIONS LISTED ON YOUR MEDICATION LIST TO YOU DURING YOUR ADMISSION IN THE HOSPITAL!    Patients discharged on the day of surgery will not be allowed to drive home.  Someone NEEDS to stay with you for the first 24 hours after anesthesia.   Special Instructions: Bring a copy of your healthcare power of attorney and living will documents         the day of surgery if you haven't scanned them before.              Please read over the following fact sheets you were given: IF YOU HAVE QUESTIONS ABOUT YOUR PRE-OP INSTRUCTIONS PLEASE CALL [phone]    [facility] - Preparing for Surgery Before surgery, you can play an important role.  Because skin is not sterile, your skin needs to be as free of germs as possible.  You can reduce the number of germs on your skin by washing with CHG (chlorahexidine gluconate) soap before surgery.  CHG is an antiseptic cleaner which kills germs and bonds with the skin to continue killing germs even after washing. Please DO NOT use if you have an allergy to CHG or  antibacterial soaps.  If your skin becomes reddened/irritated stop using the CHG and inform your nurse when you arrive at Short Stay. Do not shave (including legs and underarms) for at least 48 hours prior to the first CHG shower.  You may shave your face/neck. Please follow these instructions carefully:  1.  Shower with CHG Soap the night before surgery and the  morning of Surgery.  2.  If you choose to wash your hair, wash your hair first as usual with your  normal  shampoo.  3.  After you shampoo, rinse your hair and body thoroughly to remove the  shampoo.                           4.  Use CHG as you would any other liquid soap.  You can apply chg directly  to the skin and wash                       Gently with a scrungie or clean washcloth.  5.  Apply the CHG Soap to your body ONLY FROM THE NECK DOWN.   Do not use on face/ open                           Wound or open sores. Avoid contact with eyes, ears  mouth and genitals (private parts).                       Wash face,  Genitals (private parts) with your normal soap.             6.  Wash thoroughly, paying special attention to the area where your surgery  will be performed.  7.  Thoroughly rinse your body with warm water  from the neck down.  8.  DO NOT shower/wash with your normal soap after using and rinsing off  the CHG Soap.                9.  Pat yourself dry with a clean towel.            10.  Wear clean pajamas.            11.  Place clean sheets on your bed the night of your first shower and do not  sleep with pets. Day of Surgery : Do not apply any lotions/deodorants the morning of surgery.  Please wear clean clothes to the hospital/surgery center.  FAILURE TO FOLLOW THESE INSTRUCTIONS MAY RESULT IN THE CANCELLATION OF YOUR SURGERY PATIENT SIGNATURE_________________________________  NURSE SIGNATURE__________________________________  ________________________________________________________________________  Cesar Davis  An incentive spirometer is a tool that can help keep your lungs clear and active. This tool measures how well you are filling your lungs with each breath. Taking long deep breaths may help reverse or decrease the chance of developing breathing (pulmonary) problems (especially infection) following: A long period of time when you are unable to move or be active. BEFORE THE PROCEDURE  If the spirometer includes an indicator to show your best effort, your nurse or respiratory therapist will set it to a desired goal. If possible, sit up straight or lean slightly forward. Try not to slouch. Hold the incentive spirometer in an upright position. INSTRUCTIONS FOR USE  Sit on the edge of your bed if possible, or sit up as far as you can in bed or on a chair. Hold the incentive spirometer in an upright position. Breathe out normally. Place the mouthpiece in your mouth and seal your lips tightly around it. Breathe in slowly and as deeply as possible, raising the piston or the ball toward the top of the column. Hold your breath for 3-5 seconds or for as long as possible. Allow the piston or ball to fall to the bottom of the column. Remove the mouthpiece from your mouth and breathe out normally. Rest for a few seconds and repeat Steps 1 through 7 at least 10 times every 1-2 hours when you are awake. Take your time and take a few normal breaths between deep breaths. The spirometer may include an indicator to show your best effort. Use the indicator as a goal to work toward during each repetition. After each set of 10 deep breaths, practice coughing to be sure your lungs are clear. If you have an incision (the cut made at the time of surgery), support your incision when coughing by placing a pillow or rolled up towels firmly against it. Once you are able to get out of bed, walk around indoors and cough well. You may stop using the incentive spirometer when instructed by your caregiver.  RISKS AND  COMPLICATIONS Take your time so you do not get dizzy or light-headed. If you are in pain, you may need to take or ask for pain medication before doing incentive spirometry. It is  harder to take a deep breath if you are having pain. AFTER USE Rest and breathe slowly and easily. It can be helpful to keep track of a log of your progress. Your caregiver can provide you with a simple table to help with this. If you are using the spirometer at home, follow these instructions: SEEK MEDICAL CARE IF:  You are having difficultly using the spirometer. You have trouble using the spirometer as often as instructed. Your pain medication is not giving enough relief while using the spirometer. You develop fever of 100.5 F (38.1 C) or higher. SEEK IMMEDIATE MEDICAL CARE IF:  You cough up bloody sputum that had not been present before. You develop fever of 102 F (38.9 C) or greater. You develop worsening pain at or near the incision site. MAKE SURE YOU:  Understand these instructions. Will watch your condition. Will get help right away if you are not doing well or get worse. Document Released: 01/30/2007 Document Revised: 12/12/2011 Document Reviewed: 04/02/2007 [facility] Patient Information 2014 [place], [place].   ________________________________________________________________________

## 2023-10-18 ENCOUNTER — Encounter (HOSPITAL_COMMUNITY)
Admission: RE | Admit: 2023-10-18 | Discharge: 2023-10-18 | Disposition: A | Discharge status: Home / Self Care | Payer: Medicare HMO | Source: Ambulatory Visit | Attending: Orthopaedic Surgery | Admitting: Orthopaedic Surgery

## 2023-10-18 ENCOUNTER — Other Ambulatory Visit: Payer: Self-pay

## 2023-10-18 ENCOUNTER — Encounter (HOSPITAL_COMMUNITY): Payer: Self-pay

## 2023-10-18 VITALS — BP 153/102 | HR 64 | Temp 98.1°F | Ht <= 58 in | Wt 241.0 lb

## 2023-10-18 DIAGNOSIS — Z01818 Encounter for other preprocedural examination: Secondary | ICD-10-CM | POA: Insufficient documentation

## 2023-10-18 DIAGNOSIS — I10 Essential (primary) hypertension: Secondary | ICD-10-CM | POA: Insufficient documentation

## 2023-10-18 LAB — BASIC METABOLIC PANEL
Anion gap: 11 (ref 5–15)
BUN: 13 mg/dL (ref 8–23)
CO2: 25 mmol/L (ref 22–32)
Calcium: 9.4 mg/dL (ref 8.9–10.3)
Chloride: 100 mmol/L (ref 98–111)
Creatinine, Ser: 0.92 mg/dL (ref 0.61–1.24)
GFR, Estimated: >60 mL/min (ref >60)
Glucose, Bld: 106 mg/dL — ABNORMAL HIGH (ref 70–99)
Potassium: 4.3 mmol/L (ref 3.5–5.1)
Sodium: 136 mmol/L (ref 135–145)

## 2023-10-18 LAB — CBC
HCT: 49.6 % (ref 39.0–52.0)
Hemoglobin: 16.5 g/dL (ref 13.0–17.0)
MCH: 28.9 pg (ref 26.0–34.0)
MCHC: 33.3 g/dL (ref 30.0–36.0)
MCV: 87 fL (ref 80.0–100.0)
Platelets: 446 10*3/uL — ABNORMAL HIGH (ref 150–400)
RBC: 5.7 MIL/uL (ref 4.22–5.81)
RDW: 13.1 % (ref 11.5–15.5)
WBC: 6.8 10*3/uL (ref 4.0–10.5)
nRBC: 0 % (ref 0.0–0.2)

## 2023-10-18 NOTE — Progress Notes (Signed)
 For Anesthesia: PCP - Ricarda Challenger, PA . LOV: 07/11/23 Cardiologist - N/A  Bowel Prep reminder:  Chest x-ray -  EKG - 10/18/23 Stress Test -  ECHO -  Cardiac Cath -  Pacemaker/ICD device last checked: Pacemaker orders received: Device Rep notified:  Spinal Cord Stimulator:N/A  Sleep Study - N/A CPAP -   Fasting Blood Sugar - N/A Checks Blood Sugar _____ times a day Date and result of last Hgb A1c-  Last dose of GLP1 agonist- N/A GLP1 instructions:   Last dose of SGLT-2 inhibitors- N/A SGLT-2 instructions:   Blood Thinner Instructions:N/A Aspirin  Instructions: Last Dose:  Activity level: Can go up a flight of stairs and activities of daily living without stopping and without chest pain and/or shortness of breath   Able to exercise without chest pain and/or shortness of breath  Anesthesia review: Hx: HTN,Pre-DIA. BP was elevated during PST appointment : 156/102;153/102. After reviewing medications,RN noticed that pt. Was taking 25 mg of metoprolol ,the order said 50 mg.Pt. was advised to follow MD. Recommendations for his blood pressure.  Patient denies shortness of breath, fever, cough and chest pain at PAT appointment   Patient verbalized understanding of instructions that were given to them at the PAT appointment. Patient was also instructed that they will need to review over the PAT instructions again at home before surgery.

## 2023-10-23 NOTE — H&P (Signed)
Cesar Davis is an 68 y.o. male.   Chief Complaint: left knee pain HPI: Cesar Davis persists with terrible left knee pain.  This is a new and much worse pain than anything he has had in the past.  He had been doing well until this acute bout that occurred over the past week or 2.  He experiences locking and has to twist his knee to unlock it.  He continues on a cane.  He is on an anti-inflammatory.  He has been through an MRI scan since last time he was in.    MRI:  I reviewed an MRI scan films and report of a study done at the [facility] on 10/09/23.  He has a medial meniscus tear with a flap fragment.  He also has some moderate degenerative changes medial and patellofemoral.  Past Medical History:  Diagnosis Date   Arthritis    "right shoulder; knees; left ankle" (01/09/2013)   History of shingles 01/2011   Hypertension    Pre-diabetes    Pt denies    Past Surgical History:  Procedure Laterality Date   ANKLE FRACTURE SURGERY Left 2004?   ARTHROSCOPIC REPAIR PCL Right    EYE SURGERY     lasic   FINGER SURGERY Bilateral 10/04/1983   "fusion pinky;    INGUINAL HERNIA REPAIR Right 10/03/2004   JOINT REPLACEMENT Right    shoulder   KNEE ARTHROSCOPY Right 2010; 12/2012   ORIF HUMERUS FRACTURE Right 03/11/2021   Procedure: OPEN REDUCTION INTERNAL FIXATION RIGHT PERIPROSTHETIC HUMERUS FRACTURE;  Surgeon: Jones Broom, MD;  Location: [facility];  Service: Orthopedics;  Laterality: Right;   TOTAL KNEE ARTHROPLASTY Right 01/09/2013   TOTAL KNEE ARTHROPLASTY Right 01/09/2013   Procedure: TOTAL KNEE ARTHROPLASTY;  Surgeon: Nestor Lewandowsky, MD;  Location: [facility] OR;  Service: Orthopedics;  Laterality: Right;   TOTAL KNEE REVISION Right 06/29/2021   Procedure: RIGHT  KNEE REVISION TIBIA;  Surgeon: Marcene Corning, MD;  Location: [facility];  Service: Orthopedics;  Laterality: Right;   TOTAL SHOULDER ARTHROPLASTY Right 09/19/2013   DR CHANDLER    TOTAL SHOULDER ARTHROPLASTY Right 09/19/2013   Procedure: TOTAL  SHOULDER ARTHROPLASTY;  Surgeon: Mable Paris, MD;  Location[facility];  Service: Orthopedics;  Laterality: Right;  REVISION HEMIARTHROPLASTY TO TOTAL SHOULDER   TOTAL SHOULDER REPLACEMENT Right ~ 2006    No family history on file. Social History:  reports that he quit smoking about 24 years ago. His smoking use included cigarettes. He started smoking about 39 years ago. He has a 7.5 pack-year smoking history. His smokeless tobacco use includes snuff. He reports current alcohol use. He reports that he does not use drugs.  Allergies: No Known Allergies  No medications prior to admission.    No results found for this or any previous visit (from the past 48 hours). No results found.  Review of Systems  Musculoskeletal:  Positive for arthralgias.       Left knee  All other systems reviewed and are negative.   There were no vitals taken for this visit. Physical Exam Constitutional:      Appearance: Normal appearance.  HENT:     Nose: Nose normal.     Mouth/Throat:     Pharynx: Oropharynx is clear.  Pulmonary:     Effort: Pulmonary effort is normal.  Abdominal:     Palpations: Abdomen is soft.  Musculoskeletal:     Cervical back: Normal range of motion.     Comments: Left knee motion  is about 5-120.  He has extreme pain on attempted full extension.  He has exquisite medial joint line pain.  Murray's test is positive for pain in that direction.  Today he does have trace effusion.  Hip motion is good and straight leg raise is negative.    Skin:    General: Skin is warm and dry.  Neurological:     General: No focal deficit present.     Mental Status: He is alert and oriented to person, place, and time.  Psychiatric:        Mood and Affect: Mood normal.        Behavior: Behavior normal.        Thought Content: Thought content normal.        Judgment: Judgment normal.      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  For clarification purposes this is a new current problem.  He is afraid he is going to fall.  He is using a cane.  We have injected him on a couple of occasions.  I do not see much of an alternative to a knee arthroscopy.  I reviewed risk of anesthesia, infection, DVT.  His insurance carrier sometimes requires some preoperative physical therapy.    Ginger Organ Trew Sunde, PA-C 10/23/2023, 1:16 PM

## 2023-10-24 ENCOUNTER — Encounter (HOSPITAL_COMMUNITY)
Admission: RE | Disposition: A | Discharge status: Home / Self Care | Payer: Self-pay | Source: Home / Self Care | Attending: Orthopaedic Surgery

## 2023-10-24 ENCOUNTER — Ambulatory Visit (HOSPITAL_COMMUNITY)
Admission: RE | Admit: 2023-10-24 | Discharge: 2023-10-24 | Disposition: A | Discharge status: Home / Self Care | Payer: Medicare HMO | Attending: Orthopaedic Surgery | Admitting: Orthopaedic Surgery

## 2023-10-24 ENCOUNTER — Ambulatory Visit (HOSPITAL_BASED_OUTPATIENT_CLINIC_OR_DEPARTMENT_OTHER): Payer: Medicare HMO | Admitting: Anesthesiology

## 2023-10-24 ENCOUNTER — Encounter (HOSPITAL_COMMUNITY): Payer: Self-pay | Admitting: Orthopaedic Surgery

## 2023-10-24 ENCOUNTER — Ambulatory Visit (HOSPITAL_COMMUNITY): Payer: Medicare HMO | Admitting: Anesthesiology

## 2023-10-24 DIAGNOSIS — I10 Essential (primary) hypertension: Secondary | ICD-10-CM | POA: Insufficient documentation

## 2023-10-24 DIAGNOSIS — M1712 Unilateral primary osteoarthritis, left knee: Secondary | ICD-10-CM

## 2023-10-24 DIAGNOSIS — X58XXXA Exposure to other specified factors, initial encounter: Secondary | ICD-10-CM | POA: Diagnosis not present

## 2023-10-24 DIAGNOSIS — Z96651 Presence of right artificial knee joint: Secondary | ICD-10-CM | POA: Diagnosis not present

## 2023-10-24 DIAGNOSIS — Z87891 Personal history of nicotine dependence: Secondary | ICD-10-CM | POA: Diagnosis not present

## 2023-10-24 DIAGNOSIS — M25562 Pain in left knee: Secondary | ICD-10-CM

## 2023-10-24 DIAGNOSIS — S83232A Complex tear of medial meniscus, current injury, left knee, initial encounter: Secondary | ICD-10-CM | POA: Insufficient documentation

## 2023-10-24 DIAGNOSIS — S83242A Other tear of medial meniscus, current injury, left knee, initial encounter: Secondary | ICD-10-CM | POA: Diagnosis not present

## 2023-10-24 HISTORY — PX: KNEE ARTHROSCOPY: SHX127

## 2023-10-24 SURGERY — ARTHROSCOPY, KNEE
Anesthesia: General | Site: Knee | Laterality: Left

## 2023-10-24 MED ORDER — CEFAZOLIN SODIUM-DEXTROSE 2-4 GM/100ML-% IV SOLN
2.0000 g | INTRAVENOUS | Status: AC
  Administered 2023-10-24: 2 g via INTRAVENOUS
  Filled 2023-10-24: qty 100

## 2023-10-24 MED ORDER — HYDROMORPHONE HCL 2 MG/ML IJ SOLN
INTRAMUSCULAR | Status: AC
  Filled 2023-10-24: qty 1

## 2023-10-24 MED ORDER — ONDANSETRON HCL 4 MG/2ML IJ SOLN
INTRAMUSCULAR | Status: DC | PRN
  Administered 2023-10-24: 4 mg via INTRAVENOUS

## 2023-10-24 MED ORDER — OXYCODONE-ACETAMINOPHEN 5-325 MG PO TABS: 1.0000 | ORAL_TABLET | Freq: Four times a day (QID) | ORAL | 0 refills | Status: DC | PRN

## 2023-10-24 MED ORDER — FENTANYL CITRATE PF 50 MCG/ML IJ SOSY
PREFILLED_SYRINGE | INTRAMUSCULAR | Status: AC
  Filled 2023-10-24: qty 1

## 2023-10-24 MED ORDER — PROPOFOL 10 MG/ML IV BOLUS
INTRAVENOUS | Status: DC | PRN
  Administered 2023-10-24: 240 mg via INTRAVENOUS

## 2023-10-24 MED ORDER — OXYCODONE HCL 5 MG PO TABS: 5.0000 mg | ORAL_TABLET | Freq: Once | ORAL | Status: AC | PRN

## 2023-10-24 MED ORDER — OXYCODONE HCL 5 MG PO TABS
5.0000 mg | ORAL_TABLET | Freq: Once | ORAL | Status: AC | PRN
  Administered 2023-10-24: 5 mg via ORAL

## 2023-10-24 MED ORDER — FENTANYL CITRATE PF 50 MCG/ML IJ SOSY
25.0000 ug | PREFILLED_SYRINGE | INTRAMUSCULAR | Status: DC | PRN
  Administered 2023-10-24 (×2): 50 ug via INTRAVENOUS

## 2023-10-24 MED ORDER — ACETAMINOPHEN 10 MG/ML IV SOLN: 1000.0000 mg | Freq: Once | INTRAVENOUS | Status: DC | PRN

## 2023-10-24 MED ORDER — ACETAMINOPHEN 160 MG/5ML PO SOLN: 325.0000 mg | ORAL | Status: DC | PRN

## 2023-10-24 MED ORDER — OXYCODONE HCL 5 MG PO TABS
ORAL_TABLET | ORAL | Status: AC
  Administered 2023-10-24: 5 mg via ORAL
  Filled 2023-10-24: qty 1

## 2023-10-24 MED ORDER — ORAL CARE MOUTH RINSE: 15.0000 mL | Freq: Once | OROMUCOSAL | Status: AC

## 2023-10-24 MED ORDER — EPINEPHRINE (ANAPHYLAXIS) 1 MG/ML IJ SOLN
INTRAMUSCULAR | Status: DC | PRN
  Administered 2023-10-24: 1 mL

## 2023-10-24 MED ORDER — LACTATED RINGERS IV SOLN: INTRAVENOUS | Status: DC

## 2023-10-24 MED ORDER — KETOROLAC TROMETHAMINE 30 MG/ML IJ SOLN: 30.0000 mg | Freq: Once | INTRAMUSCULAR | Status: AC | PRN

## 2023-10-24 MED ORDER — FENTANYL CITRATE PF 50 MCG/ML IJ SOSY
PREFILLED_SYRINGE | INTRAMUSCULAR | Status: AC
  Administered 2023-10-24: 50 ug via INTRAVENOUS
  Filled 2023-10-24: qty 2

## 2023-10-24 MED ORDER — FENTANYL CITRATE PF 50 MCG/ML IJ SOSY: 50.0000 ug | PREFILLED_SYRINGE | Freq: Once | INTRAMUSCULAR | Status: DC

## 2023-10-24 MED ORDER — FENTANYL CITRATE (PF) 100 MCG/2ML IJ SOLN
INTRAMUSCULAR | Status: AC
  Filled 2023-10-24: qty 2

## 2023-10-24 MED ORDER — DEXAMETHASONE SODIUM PHOSPHATE 4 MG/ML IJ SOLN
INTRAMUSCULAR | Status: DC | PRN
  Administered 2023-10-24: 8 mg via INTRAVENOUS

## 2023-10-24 MED ORDER — KETOROLAC TROMETHAMINE 30 MG/ML IJ SOLN
INTRAMUSCULAR | Status: AC
  Administered 2023-10-24: 30 mg via INTRAVENOUS
  Filled 2023-10-24: qty 1

## 2023-10-24 MED ORDER — BUPIVACAINE-EPINEPHRINE 0.5% -1:200000 IJ SOLN
INTRAMUSCULAR | Status: DC | PRN
  Administered 2023-10-24: 20 mL

## 2023-10-24 MED ORDER — OXYCODONE HCL 5 MG PO TABS
ORAL_TABLET | ORAL | Status: AC
  Filled 2023-10-24: qty 1

## 2023-10-24 MED ORDER — FENTANYL CITRATE (PF) 100 MCG/2ML IJ SOLN
INTRAMUSCULAR | Status: DC | PRN
  Administered 2023-10-24: 100 ug via INTRAVENOUS

## 2023-10-24 MED ORDER — ACETAMINOPHEN 325 MG PO TABS
325.0000 mg | ORAL_TABLET | ORAL | Status: DC | PRN
Start: 2023-10-24 — End: 2023-10-24

## 2023-10-24 MED ORDER — HYDROMORPHONE HCL 1 MG/ML IJ SOLN
INTRAMUSCULAR | Status: DC | PRN
  Administered 2023-10-24 (×2): 1 mg via INTRAVENOUS

## 2023-10-24 MED ORDER — PROPOFOL 10 MG/ML IV BOLUS
INTRAVENOUS | Status: AC
  Filled 2023-10-24: qty 20

## 2023-10-24 MED ORDER — BUPIVACAINE HCL (PF) 0.5 % IJ SOLN
INTRAMUSCULAR | Status: AC
  Filled 2023-10-24: qty 30

## 2023-10-24 MED ORDER — SODIUM CHLORIDE 0.9 % IR SOLN
Status: DC | PRN
  Administered 2023-10-24 (×2): 3000 mL

## 2023-10-24 MED ORDER — CHLORHEXIDINE GLUCONATE 0.12 % MT SOLN
15.0000 mL | Freq: Once | OROMUCOSAL | Status: AC
  Administered 2023-10-24: 15 mL via OROMUCOSAL

## 2023-10-24 MED ORDER — EPINEPHRINE PF 1 MG/ML IJ SOLN
INTRAMUSCULAR | Status: AC
  Filled 2023-10-24: qty 1

## 2023-10-24 MED ORDER — MIDAZOLAM HCL 2 MG/2ML IJ SOLN: 1.0000 mg | Freq: Once | INTRAMUSCULAR | Status: DC

## 2023-10-24 MED ORDER — ACETAMINOPHEN 500 MG PO TABS
1000.0000 mg | ORAL_TABLET | Freq: Once | ORAL | Status: AC
  Administered 2023-10-24: 1000 mg via ORAL
  Filled 2023-10-24: qty 2

## 2023-10-24 MED ORDER — DROPERIDOL 2.5 MG/ML IJ SOLN: 0.6250 mg | Freq: Once | INTRAMUSCULAR | Status: DC | PRN

## 2023-10-24 MED ORDER — LIDOCAINE HCL (CARDIAC) PF 100 MG/5ML IV SOSY
PREFILLED_SYRINGE | INTRAVENOUS | Status: DC | PRN
  Administered 2023-10-24: 50 mg via INTRAVENOUS

## 2023-10-24 MED ORDER — OXYCODONE HCL 5 MG/5ML PO SOLN: 5.0000 mg | Freq: Once | ORAL | Status: AC | PRN

## 2023-10-24 SURGICAL SUPPLY — 35 items
BAG COUNTER SPONGE SURGICOUNT (BAG) ×1 IMPLANT
BLADE EXCALIBUR 4.0X13 (MISCELLANEOUS) IMPLANT
BNDG ELASTIC 6INX 5YD STR LF (GAUZE/BANDAGES/DRESSINGS) ×1 IMPLANT
BNDG ELASTIC 6X15 VLCR STRL LF (GAUZE/BANDAGES/DRESSINGS) IMPLANT
BNDG GAUZE DERMACEA FLUFF 4 (GAUZE/BANDAGES/DRESSINGS) ×1 IMPLANT
BONE TUNNEL PLUG CANNULATED (MISCELLANEOUS) ×1 IMPLANT
BURR OVAL 8 FLU 4.0X13 (MISCELLANEOUS) ×1 IMPLANT
COVER SURGICAL LIGHT HANDLE (MISCELLANEOUS) ×1 IMPLANT
DISSECTOR 3.5MM X 13CM (MISCELLANEOUS) ×1 IMPLANT
DRAPE ARTHROSCOPY W/POUCH 114 (DRAPES) ×1 IMPLANT
DRAPE U-SHAPE 47X51 STRL (DRAPES) ×1 IMPLANT
DRSG ADAPTIC 3X8 NADH LF (GAUZE/BANDAGES/DRESSINGS) IMPLANT
DRSG EMULSION OIL 3X3 NADH (GAUZE/BANDAGES/DRESSINGS) ×1 IMPLANT
DURAPREP 26ML APPLICATOR (WOUND CARE) ×2 IMPLANT
GAUZE 4X4 16PLY ~~LOC~~+RFID DBL (SPONGE) ×1 IMPLANT
GAUZE PAD ABD 8X10 STRL (GAUZE/BANDAGES/DRESSINGS) ×2 IMPLANT
GAUZE SPONGE 4X4 12PLY STRL (GAUZE/BANDAGES/DRESSINGS) ×1 IMPLANT
GLOVE BIO SURGEON STRL SZ8 (GLOVE) ×2 IMPLANT
GLOVE BIOGEL PI IND STRL 7.0 (GLOVE) ×1 IMPLANT
GLOVE BIOGEL PI IND STRL 8 (GLOVE) ×2 IMPLANT
GLOVE SURG SYN 7.0 (GLOVE) ×1 IMPLANT
GLOVE SURG SYN 7.0 PF PI (GLOVE) ×1 IMPLANT
GOWN SRG XL LVL 4 BRTHBL STRL (GOWNS) ×1 IMPLANT
GOWN STRL REUS W/ TWL XL LVL3 (GOWN DISPOSABLE) ×2 IMPLANT
KIT BASIN OR (CUSTOM PROCEDURE TRAY) ×1 IMPLANT
KIT TURNOVER KIT A (KITS) IMPLANT
MANIFOLD NEPTUNE II (INSTRUMENTS) ×1 IMPLANT
NDL HYPO 22X1.5 SAFETY MO (MISCELLANEOUS) ×1 IMPLANT
NEEDLE HYPO 22X1.5 SAFETY MO (MISCELLANEOUS) ×1 IMPLANT
PACK ARTHROSCOPY WL (CUSTOM PROCEDURE TRAY) ×1 IMPLANT
PADDING CAST COTTON 6X4 STRL (CAST SUPPLIES) IMPLANT
PENCIL SMOKE EVACUATOR (MISCELLANEOUS) IMPLANT
SYR 27GX1/2 1ML LL SAFETY (SYRINGE) IMPLANT
TOWEL OR 17X26 10 PK STRL BLUE (TOWEL DISPOSABLE) ×1 IMPLANT
TUBING ARTHROSCOPY IRRIG 16FT (MISCELLANEOUS) ×1 IMPLANT

## 2023-10-24 NOTE — Op Note (Signed)
NAMENEON, VANDERWALL MEDICAL RECORD NO: [id number] ACCOUNT NO: [id number] DATE OF BIRTH: 1956/05/14 FACILITY: [facility] LOCATION: [facility] PHYSICIAN: Lubertha Basque. Jerl Santos, MD  Operative Report   DATE OF PROCEDURE: 10/24/2023  PREOPERATIVE DIAGNOSES: 1. Left knee torn medial meniscus. 2. Left knee DJD.  POSTOPERATIVE DIAGNOSES: 1. Left knee torn medial meniscus. 2. Left knee DJD.  PROCEDURE PERFORMED: 1. Left knee partial medial meniscectomy. 2. Left knee abrasion chondroplasty medial and patellofemoral.  ANESTHESIA:  General.  ATTENDING SURGEON: Marcene Corning, MD  ASSISTANT: Ardelia Mems, PA  INDICATIONS: The patient is a 68 year old man with a long history of some extreme left knee pain. He has persisted with an inability to bear weight for several weeks. We have tried injection and other conservative measures. By MRI scan, he has a meniscus  tear and some degenerative change. He is offered an arthroscopy. Informed operative consent was obtained after discussion of possible complications including reaction to anesthesia and infection.  SUMMARY FINDINGS AND PROCEDURE:  Under general anesthesia, arthroscopy of the left knee was performed. Suprapatellar pouch was benign while the patellofemoral joint exhibited grade 3 and 4 change mostly through the intertrochlear groove. A thorough  chondroplasty was done along with abrasion and bleeding bone in some small areas. Medial compartment exhibited mostly grade 3 change on the femur and tibia, but he did have a small area of grade 4 change posterior on the tibia. He had a complex tear of  the middle and posterior horn of the medial meniscus addressed with about a 15% partial medial meniscectomy along with basket and shaver back to stable tissues. ACL looked normal and the lateral compartment was completely benign. The patient was  scheduled to go home the same day.  DESCRIPTION OF PROCEDURE: The patient was in the operating suite where general  anesthetic was applied without difficulty. He was positioned supine and prepped and draped in normal sterile fashion. After the administration, a preop IV Kefzol and  appropriate timeout an arthroscopy of the left knee was performed through a total of two portals. Findings as noted above. Procedure consisted predominantly of the partial medial meniscectomy, again done with basket and shaver back to stable tissues. I  also performed the indicated chondroplasties and abrasions. The knee was thoroughly irrigated at the end of the case followed by removal of arthroscopic equipment. Adaptic was placed over the portals followed by dry gauze and a loose Ace wrap. Estimated  blood loss and intraoperative fluids can be obtained from anesthesia records.  DISPOSITION: The patient was extubated in the operating room and taken to the recovery room in stable condition. Plans were for him to go home the same day and follow up in the office in less than a week. I will contact him by phone tonight.   SHY D: 10/24/2023 1:15:55 pm T: 10/24/2023 1:52:00 pm  JOB: [id number]/ [id number]

## 2023-10-24 NOTE — Anesthesia Preprocedure Evaluation (Addendum)
Anesthesia Evaluation  Patient identified by MRN, date of birth, ID band Patient awake    Reviewed: Allergy & Precautions, NPO status , Patient's Chart, lab work & pertinent test results  Airway Mallampati: III  TM Distance: >3 FB   Mouth opening: Limited Mouth Opening  Dental  (+) Teeth Intact, Dental Advisory Given   Pulmonary former smoker   breath sounds clear to auscultation       Cardiovascular hypertension, Pt. on home beta blockers and Pt. on medications  Rhythm:Regular Rate:Normal     Neuro/Psych negative neurological ROS  negative psych ROS   GI/Hepatic negative GI ROS, Neg liver ROS,,,  Endo/Other  negative endocrine ROS    Renal/GU negative Renal ROS     Musculoskeletal  (+) Arthritis ,    Abdominal   Peds  Hematology negative hematology ROS (+)   Anesthesia Other Findings   Reproductive/Obstetrics                             Anesthesia Physical Anesthesia Plan  ASA: 2  Anesthesia Plan: General   Post-op Pain Management: Tylenol PO (pre-op)*   Induction: Intravenous  PONV Risk Score and Plan: 3 and Dexamethasone, Ondansetron and Midazolam  Airway Management Planned: LMA  Additional Equipment: None  Intra-op Plan:   Post-operative Plan: Extubation in OR  Informed Consent: I have reviewed the patients History and Physical, chart, labs and discussed the procedure including the risks, benefits and alternatives for the proposed anesthesia with the patient or authorized representative who has indicated his/her understanding and acceptance.     Dental advisory given  Plan Discussed with: CRNA  Anesthesia Plan Comments:        Anesthesia Quick Evaluation

## 2023-10-24 NOTE — Brief Op Note (Signed)
VETO VANNI [id number] 10/24/2023   PRE-OP DIAGNOSIS: left TMM and DJD  POST-OP DIAGNOSIS: same  PROCEDURE: left knee scope  ANESTHESIA: general  [name]   Dictation #:  [id number]

## 2023-10-24 NOTE — Interval H&P Note (Signed)
History and Physical Interval Note:  10/24/2023 11:34 AM  Cesar Davis  has presented today for surgery, with the diagnosis of LEFT KNEE MEDIAL MENISCUS TEAR AND DEGENERATIVE JOINT DISEASE.  The various methods of treatment have been discussed with the patient and family. After consideration of risks, benefits and other options for treatment, the patient has consented to  Procedure(s): LEFT KNEE ARTHROSCOPY (Left) as a surgical intervention.  The patient's history has been reviewed, patient examined, no change in status, stable for surgery.  I have reviewed the patient's chart and labs.  Questions were answered to the patient's satisfaction.     [name]

## 2023-10-24 NOTE — Transfer of Care (Signed)
Immediate Anesthesia Transfer of Care Note  Patient: Cesar Davis  Procedure(s) Performed: LEFT KNEE ARTHROSCOPY/PARTIAL MEDIAL MENISECTOMY/CONDROPLASTY (Left: Knee)  Patient Location: PACU  Anesthesia Type:General  Level of Consciousness: awake and alert   Airway & Oxygen Therapy: Patient Spontanous Breathing and Patient connected to nasal cannula oxygen  Post-op Assessment: Report given to RN and Post -op Vital signs reviewed and stable  Post vital signs: Reviewed and stable  Last Vitals:  Vitals Value Taken Time  BP 156/87 10/24/23 1325  Temp    Pulse 84 10/24/23 1327  Resp 13 10/24/23 1327  SpO2 95 % 10/24/23 1327  Vitals shown include unfiled device data.  Last Pain:  Vitals:   10/24/23 1123  TempSrc:   PainSc: 5          Complications: No notable events documented.

## 2023-10-24 NOTE — Anesthesia Procedure Notes (Signed)
Procedure Name: LMA Insertion Date/Time: 10/24/2023 12:34 PM  Performed by: Carloyn Manner, CRNAPre-anesthesia Checklist: Patient identified, Emergency Drugs available, Suction available, Patient being monitored and Timeout performed Patient Re-evaluated:Patient Re-evaluated prior to induction Oxygen Delivery Method: Circle system utilized Preoxygenation: Pre-oxygenation with 100% oxygen Induction Type: IV induction Ventilation: Mask ventilation without difficulty LMA Size: 4.0 Placement Confirmation: positive ETCO2 and breath sounds checked- equal and bilateral Tube secured with: Tape

## 2023-10-25 ENCOUNTER — Encounter (HOSPITAL_COMMUNITY): Payer: Self-pay | Admitting: Orthopaedic Surgery

## 2023-10-25 NOTE — Anesthesia Postprocedure Evaluation (Signed)
Anesthesia Post Note  Patient: DARREN LEINEN  Procedure(s) Performed: LEFT KNEE ARTHROSCOPY/PARTIAL MEDIAL MENISECTOMY/CONDROPLASTY (Left: Knee)     Patient location during evaluation: PACU Anesthesia Type: General Level of consciousness: awake and alert Pain management: pain level controlled Vital Signs Assessment: post-procedure vital signs reviewed and stable Respiratory status: spontaneous breathing, nonlabored ventilation, respiratory function stable and patient connected to nasal cannula oxygen Cardiovascular status: blood pressure returned to baseline and stable Postop Assessment: no apparent nausea or vomiting Anesthetic complications: no   No notable events documented.  Last Vitals:  Vitals:   10/24/23 1430 10/24/23 1515  BP: 137/77 (!) 142/83  Pulse: 78 75  Resp: 15 15  Temp:  36.6 C  SpO2: 94% 94%    Last Pain:  Vitals:   10/24/23 1515  TempSrc:   PainSc: 4                  Shelton Silvas

## 2023-10-31 ENCOUNTER — Encounter (HOSPITAL_COMMUNITY): Payer: Self-pay | Admitting: Orthopaedic Surgery

## 2023-11-10 ENCOUNTER — Other Ambulatory Visit: Payer: Self-pay | Admitting: Medical

## 2023-11-24 ENCOUNTER — Other Ambulatory Visit (HOSPITAL_COMMUNITY): Payer: Self-pay

## 2024-09-05 ENCOUNTER — Encounter (HOSPITAL_BASED_OUTPATIENT_CLINIC_OR_DEPARTMENT_OTHER): Payer: Self-pay | Admitting: Emergency Medicine

## 2024-09-05 ENCOUNTER — Emergency Department (HOSPITAL_BASED_OUTPATIENT_CLINIC_OR_DEPARTMENT_OTHER)
Admission: EM | Admit: 2024-09-05 | Discharge: 2024-09-05 | Disposition: A | Discharge status: Home / Self Care | Attending: Emergency Medicine | Admitting: Emergency Medicine

## 2024-09-05 ENCOUNTER — Emergency Department (HOSPITAL_BASED_OUTPATIENT_CLINIC_OR_DEPARTMENT_OTHER)

## 2024-09-05 ENCOUNTER — Other Ambulatory Visit: Payer: Self-pay

## 2024-09-05 DIAGNOSIS — M25512 Pain in left shoulder: Secondary | ICD-10-CM

## 2024-09-05 DIAGNOSIS — W19XXXA Unspecified fall, initial encounter: Secondary | ICD-10-CM

## 2024-09-05 MED ORDER — OXYCODONE-ACETAMINOPHEN 5-325 MG PO TABS: 1.0000 | ORAL_TABLET | ORAL | 0 refills | Status: AC | PRN

## 2024-09-05 MED ORDER — OXYCODONE-ACETAMINOPHEN 5-325 MG PO TABS
1.0000 | ORAL_TABLET | Freq: Once | ORAL | Status: AC
  Administered 2024-09-05: 1 via ORAL
  Filled 2024-09-05: qty 1

## 2024-09-05 NOTE — Discharge Instructions (Addendum)
 Return if any problems.

## 2024-09-05 NOTE — ED Notes (Signed)
 Patient refused sling, Stated he has one at home that he can use.

## 2024-09-05 NOTE — ED Triage Notes (Signed)
 Pt reports mechanical fall last night, landed on L shoulder. Reports decreased ROM on LUE. Denies head injury LOC.

## 2024-09-05 NOTE — ED Provider Notes (Signed)
 [facility] Provider Note   CSN: [id number] Arrival date & time: 09/05/24  1205     Patient presents with: Shoulder Pain and Fall   Cesar Davis is a 68 y.o. male.   Patient reports he tripped and fell last night landing on his left shoulder.  Patient complains of pain in his left shoulder.  Patient reports pain with moving.  Patient reports he did not strike his head.  He denies any pain in his ribs.  Patient reports show shortness of breath no abdominal pain.  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  He has not had any problems with his left shoulder.  The history is provided by the patient and the spouse. No language interpreter was used.  Shoulder Pain Fall       Prior to Admission medications   Medication Sig Start Date End Date Taking? Authorizing Provider  oxyCODONE -acetaminophen  (PERCOCET) 5-325 MG tablet Take 1 tablet by mouth every 4 (four) hours as needed for severe pain (pain score 7-10). 09/05/24 09/05/25 Yes [place] Sonny POUR, PA-C  Cholecalciferol (VITAMIN D3) 10 MCG (400 UNIT) CAPS Take 400 Units by mouth daily.    [provider]  DULoxetine  (CYMBALTA ) 30 MG capsule Take 60 mg by mouth daily. 02/14/21   [provider]  Ferrous Sulfate  28 MG TABS Take 28 mg by mouth daily.    [provider]  lisinopril -hydrochlorothiazide  (ZESTORETIC ) 20-12.5 MG tablet TAKE 1 TABLET BY MOUTH EVERY DAY 07/05/23   Saguier, [place], PA-C  metoprolol  succinate (TOPROL -XL) 50 MG 24 hr tablet TAKE 1 TABLET BY MOUTH DAILY 12/26/22   Saguier, [place], PA-C  Multiple Vitamin (MULTIVITAMIN) capsule Take 3 capsules by mouth daily.    [provider]  Omega-3 Fatty Acids (OMEGA 3 500) 500 MG CAPS Take 500 mg by mouth daily.    [provider]  rosuvastatin  (CRESTOR ) 10 MG tablet Take 1 tablet (10 mg total) by mouth daily. 09/13/22   Saguier, [place], PA-C   tadalafil  (CIALIS ) 5 MG tablet Take 5 mg by mouth daily. 01/06/21   [provider]  vitamin B-12 (CYANOCOBALAMIN) 1000 MCG tablet Take 1,000 mcg by mouth daily.    [provider]    Allergies: Patient has no known allergies.    Review of Systems  All other systems reviewed and are negative.   Updated Vital Signs BP (!) 143/93 (BP Location: Right Arm)   Pulse 66   Temp 98.2 F (36.8 C) (Oral)   Resp 18   Ht 6' 1 (1.854 m)   Wt 100.2 kg   SpO2 97%   BMI 29.16 kg/m   Physical Exam Vitals and nursing note reviewed.  Constitutional:      Appearance: He is well-developed.  HENT:     Head: Normocephalic.  Cardiovascular:     Rate and Rhythm: Normal rate.  Pulmonary:     Effort: Pulmonary effort is normal.  Abdominal:     General: There is no distension.  Musculoskeletal:        General: Swelling and tenderness present.     Cervical back: Normal range of motion.     Comments: Tender left shoulder pain with range of motion, no deformity,  Skin:    General: Skin is warm.  Neurological:     General: No focal deficit present.     Mental Status: He is alert and oriented to person, place, and  time.     (all labs ordered are listed, but only abnormal results are displayed) Labs Reviewed - No data to display  EKG: None  Radiology: DG Shoulder Left Result Date: 09/05/2024 EXAM: 1 VIEW(S) XRAY OF THE LEFT SHOULDER 09/05/2024 12:42:00 PM COMPARISON: None available. CLINICAL HISTORY: fall FINDINGS: BONES AND JOINTS: Glenohumeral joint is normally aligned. No acute fracture. No malalignment. The [facility] joint shows mild degenerative changes. SOFT TISSUES: No abnormal calcifications. Visualized lung is unremarkable. IMPRESSION: 1. No acute fracture or dislocation. 2. Mild left acromioclavicular joint osteoarthrosis. Electronically signed by: Lynwood Seip MD 09/05/2024 01:29 PM EST RP Workstation: [id number]     Procedures   Medications Ordered in the ED   oxyCODONE -acetaminophen  (PERCOCET/ROXICET) 5-325 MG per tablet 1 tablet (1 tablet Oral Given 09/05/24 1403)                                    Medical Decision Making Patient reports he fell last p.m. and struck his left shoulder.  Patient complains of swelling and pain in his shoulder today  Amount and/or Complexity of Data Reviewed Independent Historian: spouse    Details: Patient is here with his wife who is supportive Radiology: ordered and independent interpretation performed. Decision-making details documented in ED Course.    Details: X-ray left shoulder shows no evidence of fracture.  Risk Prescription drug management. Risk Details: Patient is counseled on results he is placed in a shoulder immobilizer.  He is advised to follow-up with his orthopedist for recheck next week.  Patient is given a prescription for pain medicine.  He is discharged in stable condition        Final diagnoses:  Fall, initial encounter  Acute pain of left shoulder    ED Discharge Orders          Ordered    oxyCODONE -acetaminophen  (PERCOCET) 5-325 MG tablet  Every 4 hours PRN        09/05/24 1346           An After Visit Summary was printed and given to the patient.     Flint Sonny POUR, PA-C 09/05/24 1835    Elnor Jayson LABOR, DO 09/10/24 [phone]
